# Patient Record
Sex: Male | Born: 1955 | Race: White | Hispanic: No | Marital: Married | Smoking: Never smoker
Health system: Southern US, Community
[De-identification: ages and names within clinical notes are randomized; demographics above are authoritative.]

## PROBLEM LIST (undated history)

## (undated) DIAGNOSIS — E119 Type 2 diabetes mellitus without complications: Secondary | ICD-10-CM

## (undated) DIAGNOSIS — G4730 Sleep apnea, unspecified: Secondary | ICD-10-CM

## (undated) DIAGNOSIS — I10 Essential (primary) hypertension: Secondary | ICD-10-CM

## (undated) HISTORY — PX: JOINT REPLACEMENT: SHX530

## (undated) HISTORY — PX: BACK SURGERY: SHX140

---

## 2021-01-09 ENCOUNTER — Other Ambulatory Visit: Payer: Self-pay | Admitting: Student

## 2021-01-09 DIAGNOSIS — G8929 Other chronic pain: Secondary | ICD-10-CM

## 2021-01-09 DIAGNOSIS — M7582 Other shoulder lesions, left shoulder: Secondary | ICD-10-CM

## 2021-01-09 DIAGNOSIS — Z9889 Other specified postprocedural states: Secondary | ICD-10-CM

## 2021-01-09 DIAGNOSIS — M25512 Pain in left shoulder: Secondary | ICD-10-CM

## 2021-01-22 ENCOUNTER — Other Ambulatory Visit: Payer: Self-pay

## 2021-01-22 ENCOUNTER — Ambulatory Visit
Admission: RE | Admit: 2021-01-22 | Discharge: 2021-01-22 | Disposition: A | Discharge status: Home / Self Care | Payer: Federal, State, Local not specified - PPO | Source: Ambulatory Visit | Attending: Student | Admitting: Student

## 2021-01-22 DIAGNOSIS — M25512 Pain in left shoulder: Secondary | ICD-10-CM | POA: Diagnosis present

## 2021-01-22 DIAGNOSIS — G8929 Other chronic pain: Secondary | ICD-10-CM | POA: Diagnosis present

## 2021-01-22 DIAGNOSIS — Z9889 Other specified postprocedural states: Secondary | ICD-10-CM | POA: Diagnosis not present

## 2021-01-22 DIAGNOSIS — M7582 Other shoulder lesions, left shoulder: Secondary | ICD-10-CM

## 2021-02-04 ENCOUNTER — Encounter: Payer: Self-pay | Admitting: Emergency Medicine

## 2021-02-04 ENCOUNTER — Other Ambulatory Visit: Payer: Self-pay

## 2021-02-04 ENCOUNTER — Emergency Department
Admission: EM | Admit: 2021-02-04 | Discharge: 2021-02-04 | Disposition: A | Discharge status: Home / Self Care | Payer: Federal, State, Local not specified - PPO | Attending: Emergency Medicine | Admitting: Emergency Medicine

## 2021-02-04 DIAGNOSIS — I10 Essential (primary) hypertension: Secondary | ICD-10-CM | POA: Insufficient documentation

## 2021-02-04 DIAGNOSIS — R000 Tachycardia, unspecified: Secondary | ICD-10-CM | POA: Diagnosis not present

## 2021-02-04 DIAGNOSIS — E119 Type 2 diabetes mellitus without complications: Secondary | ICD-10-CM | POA: Insufficient documentation

## 2021-02-04 DIAGNOSIS — Z20822 Contact with and (suspected) exposure to covid-19: Secondary | ICD-10-CM | POA: Insufficient documentation

## 2021-02-04 DIAGNOSIS — K529 Noninfective gastroenteritis and colitis, unspecified: Secondary | ICD-10-CM | POA: Diagnosis not present

## 2021-02-04 DIAGNOSIS — R509 Fever, unspecified: Secondary | ICD-10-CM | POA: Diagnosis present

## 2021-02-04 HISTORY — DX: Type 2 diabetes mellitus without complications: E11.9

## 2021-02-04 HISTORY — DX: Essential (primary) hypertension: I10

## 2021-02-04 LAB — CBC WITH DIFFERENTIAL/PLATELET
Abs Immature Granulocytes: 0.04 10*3/uL (ref 0.00–0.07)
Basophils Absolute: 0 10*3/uL (ref 0.0–0.1)
Basophils Relative: 0 %
Eosinophils Absolute: 0.1 10*3/uL (ref 0.0–0.5)
Eosinophils Relative: 1 %
HCT: 31.4 % — ABNORMAL LOW (ref 39.0–52.0)
Hemoglobin: 11.2 g/dL — ABNORMAL LOW (ref 13.0–17.0)
Immature Granulocytes: 1 %
Lymphocytes Relative: 7 %
Lymphs Abs: 0.6 10*3/uL — ABNORMAL LOW (ref 0.7–4.0)
MCH: 32.4 pg (ref 26.0–34.0)
MCHC: 35.7 g/dL (ref 30.0–36.0)
MCV: 90.8 fL (ref 80.0–100.0)
Monocytes Absolute: 0.6 10*3/uL (ref 0.1–1.0)
Monocytes Relative: 7 %
Neutro Abs: 7.3 10*3/uL (ref 1.7–7.7)
Neutrophils Relative %: 84 %
Platelets: 249 10*3/uL (ref 150–400)
RBC: 3.46 MIL/uL — ABNORMAL LOW (ref 4.22–5.81)
RDW: 13.6 % (ref 11.5–15.5)
WBC: 8.6 10*3/uL (ref 4.0–10.5)
nRBC: 0 % (ref 0.0–0.2)

## 2021-02-04 LAB — COMPREHENSIVE METABOLIC PANEL
ALT: 158 U/L — ABNORMAL HIGH (ref 0–44)
AST: 56 U/L — ABNORMAL HIGH (ref 15–41)
Albumin: 3.4 g/dL — ABNORMAL LOW (ref 3.5–5.0)
Alkaline Phosphatase: 69 U/L (ref 38–126)
Anion gap: 11 (ref 5–15)
BUN: 24 mg/dL — ABNORMAL HIGH (ref 8–23)
CO2: 24 mmol/L (ref 22–32)
Calcium: 9 mg/dL (ref 8.9–10.3)
Chloride: 98 mmol/L (ref 98–111)
Creatinine, Ser: 1.01 mg/dL (ref 0.61–1.24)
GFR, Estimated: >60 mL/min (ref >60)
Glucose, Bld: 136 mg/dL — ABNORMAL HIGH (ref 70–99)
Potassium: 3.7 mmol/L (ref 3.5–5.1)
Sodium: 133 mmol/L — ABNORMAL LOW (ref 135–145)
Total Bilirubin: 2.4 mg/dL — ABNORMAL HIGH (ref 0.3–1.2)
Total Protein: 6.8 g/dL (ref 6.5–8.1)

## 2021-02-04 LAB — GROUP A STREP BY PCR: Group A Strep by PCR: NOT DETECTED

## 2021-02-04 LAB — LIPASE, BLOOD: Lipase: 24 U/L (ref 11–51)

## 2021-02-04 MED ORDER — SODIUM CHLORIDE 0.9 % IV BOLUS
1000.0000 mL | Freq: Once | INTRAVENOUS | Status: AC
  Administered 2021-02-04: 1000 mL via INTRAVENOUS

## 2021-02-04 MED ORDER — KETOROLAC TROMETHAMINE 30 MG/ML IJ SOLN
30.0000 mg | Freq: Once | INTRAMUSCULAR | Status: AC
  Administered 2021-02-04: 30 mg via INTRAVENOUS
  Filled 2021-02-04: qty 1

## 2021-02-04 MED ORDER — ONDANSETRON HCL 8 MG PO TABS: 8.0000 mg | ORAL_TABLET | Freq: Three times a day (TID) | ORAL | 0 refills | Status: DC | PRN

## 2021-02-04 MED ORDER — ONDANSETRON HCL 4 MG/2ML IJ SOLN
4.0000 mg | Freq: Once | INTRAMUSCULAR | Status: AC
  Administered 2021-02-04: 4 mg via INTRAVENOUS
  Filled 2021-02-04: qty 2

## 2021-02-04 NOTE — ED Provider Notes (Signed)
[facility] Emergency Department Provider Note   ____________________________________________   Event Date/Time   First MD Initiated Contact with Patient 02/04/21 1311     (approximate)  I have reviewed the triage vital signs and the nursing notes.   HISTORY  Chief Complaint Fever and Emesis    HPI Ricky Figueroa is a 65 y.o. male patient presents with tenderness and fever for 3 days.  Patient states symptoms began 5 days ago for sore throat.  Patient said he had diarrhea until Friday.  Patient state nausea and 3 episodes of vomiting today.  Cannot tolerate fluids.  Denies bloody stools.  Patient denies recent travel or known contact with COVID-19.  Patient has taken the COVID-19 vaccine to include boosters.  Has not taken flu shot this season.         Past Medical History:  Diagnosis Date  . Diabetes mellitus without complication (HCC)   . Hypertension     There are no problems to display for this patient.   History reviewed. No pertinent surgical history.  Prior to Admission medications   Medication Sig Start Date End Date Taking? Authorizing Provider  ondansetron (ZOFRAN) 8 MG tablet Take 1 tablet (8 mg total) by mouth every 8 (eight) hours as needed for nausea or vomiting. 02/04/21  Yes Joni Reining, PA-C    Allergies Patient has no allergy information on record.  No family history on file.  Social History Social History   Tobacco Use  . Smoking status: Never Smoker  . Smokeless tobacco: Never Used  Substance Use Topics  . Alcohol use: Yes    Comment: occasional  . Drug use: Never    Review of Systems Constitutional: No fever/chills Eyes: No visual changes. ENT: No sore throat. Cardiovascular: Denies chest pain. Respiratory: Denies shortness of breath. Gastrointestinal: No abdominal pain.  Vomiting and diarrhea.   Genitourinary: Negative for dysuria. Musculoskeletal: Negative for back pain. Skin: Negative for  rash. Neurological: Negative for headaches, focal weakness or numbness. Endocrine:  Diabetes and hypertension.  ____________________________________________   PHYSICAL EXAM:  VITAL SIGNS: ED Triage Vitals  Enc Vitals Group     BP 02/04/21 1301 110/66     Pulse Rate 02/04/21 1301 (!) 115     Resp 02/04/21 1301 18     Temp 02/04/21 1301 98.3 F (36.8 C)     Temp Source 02/04/21 1301 Oral     SpO2 02/04/21 1301 96 %     Weight 02/04/21 1302 217 lb (98.4 kg)     Height --      Head Circumference --      Peak Flow --      Pain Score --      Pain Loc --      Pain Edu? --      Excl. in GC? --     Constitutional: Alert and oriented. Well appearing and in no acute distress. Eyes: Conjunctivae are normal. PERRL. EOMI. Head: Atraumatic. Nose: No congestion/rhinnorhea. Mouth/Throat: Mucous membranes are dry.  Oropharynx non-erythematous. Neck: No stridor.  Hematological/Lymphatic/Immunilogical: No cervical lymphadenopathy. Cardiovascular: Tachycardic, regular rhythm. Grossly normal heart sounds.  Good peripheral circulation. Respiratory: Normal respiratory effort.  No retractions. Lungs CTAB. Gastrointestinal: Soft and nontender. No distention. No abdominal bruits. No CVA tenderness. Genitourinary: Deferred Musculoskeletal: No lower extremity tenderness nor edema.  No joint effusions. Neurologic:  Normal speech and language. No gross focal neurologic deficits are appreciated. No gait instability. Skin:  Skin is warm, dry and intact.  No rash noted. Psychiatric: Mood and affect are normal. Speech and behavior are normal.  ____________________________________________   LABS (all labs ordered are listed, but only abnormal results are displayed)  Labs Reviewed  COMPREHENSIVE METABOLIC PANEL - Abnormal; Notable for the following components:      Result Value   Sodium 133 (*)    Glucose, Bld 136 (*)    BUN 24 (*)    Albumin 3.4 (*)    AST 56 (*)    ALT 158 (*)    Total  Bilirubin 2.4 (*)    All other components within normal limits  CBC WITH DIFFERENTIAL/PLATELET - Abnormal; Notable for the following components:   RBC 3.46 (*)    Hemoglobin 11.2 (*)    HCT 31.4 (*)    Lymphs Abs 0.6 (*)    All other components within normal limits  GROUP A STREP BY PCR  SARS CORONAVIRUS 2 (TAT 6-24 HRS)  LIPASE, BLOOD   ____________________________________________  EKG   ____________________________________________  RADIOLOGY I, Joni Reining, personally viewed and evaluated these images (plain radiographs) as part of my medical decision making, as well as reviewing the written report by the radiologist.  ED MD interpretation:   Official radiology report(s): No results found.  ____________________________________________   PROCEDURES  Procedure(s) performed (including Critical Care):  Procedures   ____________________________________________   INITIAL IMPRESSION / ASSESSMENT AND PLAN / ED COURSE  As part of my medical decision making, I reviewed the following data within the electronic MEDICAL RECORD NUMBER         Patient presents with nausea and vomiting for few days.  Patient complaint was preceded by a sore throat.  Patient had 3 episodes of vomiting today.  Discussed negative Covid 19 other lab results with patient.  Patient responded well to IV rehydration and Zofran.  Patient given discharge care instruction advised take medication as directed.      ____________________________________________   FINAL CLINICAL IMPRESSION(S) / ED DIAGNOSES  Final diagnoses:  Gastroenteritis     ED Discharge Orders         Ordered    ondansetron (ZOFRAN) 8 MG tablet  Every 8 hours PRN        02/04/21 1522          *Please note:  Dennie Moltz was evaluated in Emergency Department on 02/05/2021 for the symptoms described in the history of present illness. He was evaluated in the context of the global COVID-19 pandemic, which necessitated  consideration that the patient might be at risk for infection with the SARS-CoV-2 virus that causes COVID-19. Institutional protocols and algorithms that pertain to the evaluation of patients at risk for COVID-19 are in a state of rapid change based on information released by regulatory bodies including the CDC and federal and state organizations. These policies and algorithms were followed during the patient's care in the ED.  Some ED evaluations and interventions may be delayed as a result of limited staffing during and the pandemic.*   Note:  This document was prepared using Dragon voice recognition software and may include unintentional dictation errors.    Joni Reining, PA-C 02/05/21 1544    [place]ton [place], MD 02/07/21 [phone]

## 2021-02-04 NOTE — Discharge Instructions (Addendum)
Follow discharge care instruction take medication as directed.  Follow-up with your treating doctor if no improvement in 3 days.  Return to ED if condition worsens.  Advised self quarantine pending results of COVID-19 test.  Test results can be found in the MyChart app later today or in the morning.  If test is positive must quarantine per CDC recommendations.

## 2021-02-04 NOTE — ED Triage Notes (Signed)
Pt in w/emesis and fever x few days. States symptoms first began last Wednesday w/sore throat, and he had diarrhea Friday. Emesis x few days, 3 episodes today. Temp 98.3 in triage. Denies any sob or cp

## 2021-02-04 NOTE — ED Triage Notes (Signed)
Denies any sick contacts

## 2021-02-05 LAB — SARS CORONAVIRUS 2 (TAT 6-24 HRS): SARS Coronavirus 2: NEGATIVE

## 2021-02-08 ENCOUNTER — Other Ambulatory Visit: Payer: Self-pay | Admitting: Family Medicine

## 2021-02-08 ENCOUNTER — Other Ambulatory Visit: Payer: Self-pay

## 2021-02-08 ENCOUNTER — Ambulatory Visit
Admission: RE | Admit: 2021-02-08 | Discharge: 2021-02-08 | Disposition: A | Discharge status: Home / Self Care | Payer: Federal, State, Local not specified - PPO | Source: Ambulatory Visit | Attending: Family Medicine | Admitting: Family Medicine

## 2021-02-08 DIAGNOSIS — R112 Nausea with vomiting, unspecified: Secondary | ICD-10-CM

## 2021-02-08 DIAGNOSIS — R17 Unspecified jaundice: Secondary | ICD-10-CM | POA: Diagnosis present

## 2021-02-08 DIAGNOSIS — R197 Diarrhea, unspecified: Secondary | ICD-10-CM | POA: Insufficient documentation

## 2021-02-11 ENCOUNTER — Other Ambulatory Visit: Payer: Self-pay | Admitting: Internal Medicine

## 2021-02-11 DIAGNOSIS — R509 Fever, unspecified: Secondary | ICD-10-CM

## 2021-02-11 DIAGNOSIS — R7989 Other specified abnormal findings of blood chemistry: Secondary | ICD-10-CM

## 2021-02-11 DIAGNOSIS — R1010 Upper abdominal pain, unspecified: Secondary | ICD-10-CM

## 2021-02-12 ENCOUNTER — Other Ambulatory Visit: Payer: Self-pay

## 2021-02-12 ENCOUNTER — Ambulatory Visit
Admission: RE | Admit: 2021-02-12 | Discharge: 2021-02-12 | Disposition: A | Discharge status: Home / Self Care | Payer: Federal, State, Local not specified - PPO | Source: Ambulatory Visit | Attending: Internal Medicine | Admitting: Internal Medicine

## 2021-02-12 DIAGNOSIS — R509 Fever, unspecified: Secondary | ICD-10-CM | POA: Diagnosis present

## 2021-02-12 DIAGNOSIS — R7989 Other specified abnormal findings of blood chemistry: Secondary | ICD-10-CM | POA: Diagnosis present

## 2021-02-12 DIAGNOSIS — R1010 Upper abdominal pain, unspecified: Secondary | ICD-10-CM | POA: Diagnosis present

## 2021-04-08 ENCOUNTER — Other Ambulatory Visit: Payer: Self-pay | Admitting: Internal Medicine

## 2021-04-08 DIAGNOSIS — I7090 Unspecified atherosclerosis: Secondary | ICD-10-CM

## 2021-04-08 DIAGNOSIS — R42 Dizziness and giddiness: Secondary | ICD-10-CM

## 2021-04-08 DIAGNOSIS — I708 Atherosclerosis of other arteries: Secondary | ICD-10-CM

## 2021-04-11 LAB — COLOGUARD: COLOGUARD: NEGATIVE

## 2021-04-11 LAB — EXTERNAL GENERIC LAB PROCEDURE: COLOGUARD: NEGATIVE

## 2021-04-24 ENCOUNTER — Other Ambulatory Visit: Payer: Self-pay

## 2021-04-24 ENCOUNTER — Ambulatory Visit
Admission: RE | Admit: 2021-04-24 | Discharge: 2021-04-24 | Disposition: A | Discharge status: Home / Self Care | Payer: Federal, State, Local not specified - PPO | Source: Ambulatory Visit | Attending: Internal Medicine | Admitting: Internal Medicine

## 2021-04-24 ENCOUNTER — Ambulatory Visit: Payer: Federal, State, Local not specified - PPO

## 2021-04-24 DIAGNOSIS — R42 Dizziness and giddiness: Secondary | ICD-10-CM | POA: Diagnosis not present

## 2021-04-24 DIAGNOSIS — I7090 Unspecified atherosclerosis: Secondary | ICD-10-CM

## 2021-04-24 DIAGNOSIS — I708 Atherosclerosis of other arteries: Secondary | ICD-10-CM | POA: Diagnosis present

## 2021-05-13 ENCOUNTER — Other Ambulatory Visit: Payer: Self-pay

## 2021-05-13 ENCOUNTER — Encounter: Payer: Self-pay | Admitting: Cardiology

## 2021-05-13 ENCOUNTER — Ambulatory Visit: Payer: Federal, State, Local not specified - PPO | Admitting: Cardiology

## 2021-05-13 VITALS — BP 90/58 | HR 54 | Ht 69.0 in | Wt 213.0 lb

## 2021-05-13 DIAGNOSIS — R42 Dizziness and giddiness: Secondary | ICD-10-CM

## 2021-05-13 DIAGNOSIS — E7800 Pure hypercholesterolemia, unspecified: Secondary | ICD-10-CM

## 2021-05-13 DIAGNOSIS — R072 Precordial pain: Secondary | ICD-10-CM

## 2021-05-13 DIAGNOSIS — I10 Essential (primary) hypertension: Secondary | ICD-10-CM | POA: Diagnosis not present

## 2021-05-13 MED ORDER — IVABRADINE HCL 5 MG PO TABS: ORAL_TABLET | ORAL | 0 refills | Status: DC

## 2021-05-13 NOTE — Progress Notes (Addendum)
Cardiology Office Note:    Date:  05/13/2021   ID:  Ricky Figueroa, DOB 21-Jan-1956, MRN [id number]  PCP:  Enid Baas, MD   [facility] Providers Cardiologist:  None     Referring MD: Enid Baas, MD   Chief Complaint  Patient presents with  . New Patient (Initial Visit)    Referred by PCP for Angina. Patient c.o when exerting himself - Dizziness. Meds reviewed verbally with patient.    Ricky Figueroa is a 65 y.o. male who is being seen today for the evaluation of chest pain at the request of Enid Baas, MD.   History of Present Illness:    Ricky Figueroa is a 65 y.o. male with a hx of hypertension, diabetes, hyperlipidemia who presents due to chest pain and dizziness.  Patient has symptoms of chest discomfort which he describes as soreness, rates it as 4 out of 10, located in the mid chest, not associated with exertion.  Symptoms have been ongoing for several months now.  Also endorses fatigue, dizziness ongoing for 3 to 4 months.  Symptoms began after he felt sick 3 months ago.  Had symptoms of fever, nausea.  Denies COVID infection.  His father has a history of MI in his 74s.  He denies smoking.  Past Medical History:  Diagnosis Date  . Diabetes mellitus without complication (HCC)   . Hypertension     History reviewed. No pertinent surgical history.  Current Medications: Current Meds  Medication Sig  . allopurinol (ZYLOPRIM) 300 MG tablet Take 450 mg by mouth daily.  Marland Kitchen aspirin 81 MG chewable tablet Chew 81 mg by mouth daily.  . DULoxetine (CYMBALTA) 60 MG capsule Take 60 mg by mouth 2 (two) times daily.  . folic acid (FOLVITE) 1 MG tablet Take 1 mg by mouth daily.  . isosorbide mononitrate (IMDUR) 30 MG 24 hr tablet Take 30 mg by mouth daily.  Marland Kitchen losartan-hydrochlorothiazide (HYZAAR) 100-25 MG tablet Take 0.5 tablet (50-12.5 mg) once daily  . metFORMIN (GLUCOPHAGE) 500 MG tablet Take 1 tablet by mouth 2 (two) times daily.  .  RABEprazole (ACIPHEX) 20 MG tablet Take 20 mg by mouth daily.  . simvastatin (ZOCOR) 40 MG tablet Take 40 mg by mouth daily.  . [DISCONTINUED] amLODipine (NORVASC) 5 MG tablet Take 1 tablet by mouth 2 (two) times daily.  . [DISCONTINUED] ivabradine (CORLANOR) 5 MG TABS tablet Take 2 tablets (10 mg) by mouth 1-2 hours prior to your Cardiac CT     Allergies:   Iodine and Oxycodone-acetaminophen   Social History   Socioeconomic History  . Marital status: Married    Spouse name: Not on file  . Number of children: Not on file  . Years of education: Not on file  . Highest education level: Not on file  Occupational History  . Not on file  Tobacco Use  . Smoking status: Never Smoker  . Smokeless tobacco: Never Used  Substance and Sexual Activity  . Alcohol use: Yes    Comment: occasional  . Drug use: Never  . Sexual activity: Not on file  Other Topics Concern  . Not on file  Social History Narrative  . Not on file   Social Determinants of Health   Financial Resource Strain: Not on file  Food Insecurity: Not on file  Transportation Needs: Not on file  Physical Activity: Not on file  Stress: Not on file  Social Connections: Not on file     Family History: The patient's  family history is not on file.  ROS:   Please see the history of present illness.     All other systems reviewed and are negative.  EKGs/Labs/Other Studies Reviewed:    The following studies were reviewed today:   EKG:  EKG is  ordered today.  The ekg ordered today demonstrates sinus bradycardia, heart rate 54  Recent Labs: 02/04/2021: ALT 158; BUN 24; Creatinine, Ser 1.01; Hemoglobin 11.2; Platelets 249; Potassium 3.7; Sodium 133  Recent Lipid Panel No results found for: CHOL, TRIG, HDL, CHOLHDL, VLDL, LDLCALC, LDLDIRECT   Risk Assessment/Calculations:      Physical Exam:    VS:  BP (!) 90/58 (BP Location: Left Arm, Patient Position: Sitting, Cuff Size: Normal)   Pulse (!) 54   Ht  (1.753  m)   Wt 213 lb (96.6 kg)   SpO2 98%   BMI 31.45 kg/m     Wt Readings from Last 3 Encounters:  05/13/21 213 lb (96.6 kg)  02/04/21 217 lb (98.4 kg)     GEN:  Well nourished, well developed in no acute distress HEENT: Normal NECK: No JVD; No carotid bruits LYMPHATICS: No lymphadenopathy CARDIAC: RRR, no murmurs, rubs, gallops RESPIRATORY:  Clear to auscultation without rales, wheezing or rhonchi  ABDOMEN: Soft, non-tender, non-distended MUSCULOSKELETAL:  No edema; No deformity  SKIN: Warm and dry NEUROLOGIC:  Alert and oriented x 3 PSYCHIATRIC:  Normal affect   ASSESSMENT:    1. Precordial pain   2. Dizziness   3. Primary hypertension   4. Pure hypercholesterolemia    PLAN:    In order of problems listed above:  1. Chest pain, risk factors hypertension, hyperlipidemia, family history of CAD, diabetes. obtain [employer] (patient has contrast/cardiac diet allergies-anaphylaxis listed), obtain echo echocardiogram.  Continue Imdur. 2. Dizziness, vitals in the office were orthostatic, systolic blood pressures 90/58.  Stop amlodipine, reduce Hyzaar to 50-12.5 mg daily.  Advised to check BP daily.  Adjust medications as needed. 3. Hypertension, BP low today.  Adjust BP meds as above. 4. Hyperlipidemia, continue statin.  Follow-up after echo and stress testing  Shared Decision Making/Informed Consent The risks [chest pain, shortness of breath, cardiac arrhythmias, dizziness, blood pressure fluctuations, myocardial infarction, stroke/transient ischemic attack, nausea, vomiting, allergic reaction, radiation exposure, metallic taste sensation and life-threatening complications (estimated to be 1 in 10,000)], benefits (risk stratification, diagnosing coronary artery disease, treatment guidance) and alternatives of a nuclear stress test were discussed in detail with [name] and he agrees to proceed.    Medication Adjustments/Labs and Tests Ordered: Current medicines are  reviewed at length with the patient today.  Concerns regarding medicines are outlined above.  Orders Placed This Encounter  Procedures  . NM Myocar Multi W/Spect W/Wall Motion / EF  . Cardiac Stress Test: Informed Consent Details: Physician/Practitioner Attestation; Transcribe to consent form and obtain patient signature  . EKG 12-Lead  . ECHOCARDIOGRAM COMPLETE   Meds ordered this encounter  Medications  . DISCONTD: ivabradine (CORLANOR) 5 MG TABS tablet    Sig: Take 2 tablets (10 mg) by mouth 1-2 hours prior to your Cardiac CT    Dispense:  2 tablet    Refill:  0    Patient Instructions   Medication Instructions:  - Your physician has recommended you make the following change in your medication:   1) STOP norvasc (amlodipine)  2) DECREASE hyzaar 100-25 mg- take 0.5 tablet (50-12.5 mg) by mouth once daily    *If you need a refill on  your cardiac medications before your next appointment, please call your pharmacy*   Lab Work: - none ordered  If you have labs (blood work) drawn today and your tests are completely normal, you will receive your results only by: Marland Kitchen MyChart Message (if you have MyChart) OR . A paper copy in the mail If you have any lab test that is abnormal or we need to change your treatment, we will call you to review the results.   Testing/Procedures:  1) Echocardiogram:  - Your physician has requested that you have an echocardiogram. Echocardiography is a painless test that uses sound waves to create images of your heart. It provides your doctor with information about the size and shape of your heart and how well your heart's chambers and valves are working. This procedure takes approximately one hour. There are no restrictions for this procedure.There is a possibility that an IV may need to be started during your test to inject an image enhancing agent. This is done to obtain more optimal pictures of your heart. Therefore we ask that you do at least drink  some water prior to coming in to hydrate your veins.    2) Lexiscan Myoview: - Your physician has requested that you have a lexiscan myoview.   [facility] MYOVIEW  Your caregiver has ordered a Stress Test with nuclear imaging. The purpose of this test is to evaluate the blood supply to your heart muscle. This procedure is referred to as a "Non-Invasive Stress Test." This is because other than having an IV started in your vein, nothing is inserted or "invades" your body. Cardiac stress tests are done to find areas of poor blood flow to the heart by determining the extent of coronary artery disease (CAD). Some patients exercise on a treadmill, which naturally increases the blood flow to your heart, while others who are  unable to walk on a treadmill due to physical limitations have a pharmacologic/chemical stress agent called Lexiscan . This medicine will mimic walking on a treadmill by temporarily increasing your coronary blood flow.   Please note: these test may take anywhere between 2-4 hours to complete  PLEASE REPORT TO [facility] MEDICAL MALL ENTRANCE  THE VOLUNTEERS AT THE FIRST DESK WILL DIRECT YOU WHERE TO GO  Date of Procedure:_____________________________________  Arrival Time for Procedure:______________________________  Instructions regarding medication:   _x___ : Hold ALL diabetes medication morning of procedure  _x__:  You may take all of your other regular medications the morning of your test with enough water to get them down safely  PLEASE NOTIFY THE OFFICE AT LEAST 24 HOURS IN ADVANCE IF YOU ARE UNABLE TO KEEP YOUR APPOINTMENT.  [phone] AND  PLEASE NOTIFY NUCLEAR MEDICINE AT [facility] AT LEAST 24 HOURS IN ADVANCE IF YOU ARE UNABLE TO KEEP YOUR APPOINTMENT. [phone]  How to prepare for your Myoview test:  5. Do not eat or drink after midnight 6. No caffeine for 24 hours prior to test 7. No smoking 24 hours prior to test. 8. Your medication may be taken with water.  If your  doctor stopped a medication because of this test, do not take that medication. 9. Ladies, please do not wear dresses.  Skirts or pants are appropriate. Please wear a short sleeve shirt. 10. No perfume, cologne or lotion. 11. Wear comfortable walking shoes. No heels!    Follow-Up: At [facility], you and your health needs are our priority.  As part of our continuing mission to provide you with exceptional heart care,  we have created designated Provider Care Teams.  These Care Teams include your primary Cardiologist (physician) and Advanced Practice Providers (APPs -  Physician Assistants and Nurse Practitioners) who all work together to provide you with the care you need, when you need it.  We recommend signing up for the patient portal called "MyChart".  Sign up information is provided on this After Visit Summary.  MyChart is used to connect with patients for Virtual Visits (Telemedicine).  Patients are able to view lab/test results, encounter notes, upcoming appointments, etc.  Non-urgent messages can be sent to your provider as well.   To learn more about what you can do with MyChart, go to [email or web address].    Your next appointment:   6 week(s)/ after all testing is completed  The format for your next appointment:   In Person  Provider:   You may see Debbe Odea, MD or one of the following Advanced Practice Providers on your designated Care Team:    Nicolasa Ducking, NP  Eula Listen, PA-C  Marisue Ivan, PA-C  Cadence Miranda, [place]  Gillian Shields, NP    Other Instructions   Echocardiogram An echocardiogram is a test that uses sound waves (ultrasound) to produce images of the heart. Images from an echocardiogram can provide important information about:  Heart size and shape.  The size and thickness and movement of your heart's walls.  Heart muscle function and strength.  Heart valve function or if you have stenosis. Stenosis is when the heart valves  are too narrow.  If blood is flowing backward through the heart valves (regurgitation).  A tumor or infectious growth around the heart valves.  Areas of heart muscle that are not working well because of poor blood flow or injury from a heart attack.  Aneurysm detection. An aneurysm is a weak or damaged part of an artery wall. The wall bulges out from the normal force of blood pumping through the body. Tell a health care provider about:  Any allergies you have.  All medicines you are taking, including vitamins, herbs, eye drops, creams, and over-the-counter medicines.  Any blood disorders you have.  Any surgeries you have had.  Any medical conditions you have.  Whether you are pregnant or may be pregnant. What are the risks? Generally, this is a safe test. However, problems may occur, including an allergic reaction to dye (contrast) that may be used during the test. What happens before the test? No specific preparation is needed. You may eat and drink normally. What happens during the test?  You will take off your clothes from the waist up and put on a hospital gown.  Electrodes or electrocardiogram (ECG)patches may be placed on your chest. The electrodes or patches are then connected to a device that monitors your heart rate and rhythm.  You will lie down on a table for an ultrasound exam. A gel will be applied to your chest to help sound waves pass through your skin.  A handheld device, called a transducer, will be pressed against your chest and moved over your heart. The transducer produces sound waves that travel to your heart and bounce back (or "echo" back) to the transducer. These sound waves will be captured in real-time and changed into images of your heart that can be viewed on a video monitor. The images will be recorded on a computer and reviewed by your health care provider.  You may be asked to change positions or hold your breath for a short time. This  makes it  easier to get different views or better views of your heart.  In some cases, you may receive contrast through an IV in one of your veins. This can improve the quality of the pictures from your heart. The procedure may vary among health care providers and hospitals.   What can I expect after the test? You may return to your normal, everyday life, including diet, activities, and medicines, unless your health care provider tells you not to do that. Follow these instructions at home:  It is up to you to get the results of your test. Ask your health care provider, or the department that is doing the test, when your results will be ready.  Keep all follow-up visits. This is important. Summary  An echocardiogram is a test that uses sound waves (ultrasound) to produce images of the heart.  Images from an echocardiogram can provide important information about the size and shape of your heart, heart muscle function, heart valve function, and other possible heart problems.  You do not need to do anything to prepare before this test. You may eat and drink normally.  After the echocardiogram is completed, you may return to your normal, everyday life, unless your health care provider tells you not to do that. This information is not intended to replace advice given to you by your health care provider. Make sure you discuss any questions you have with your health care provider. Document Revised: 07/17/2020 Document Reviewed: 07/17/2020 Elsevier Patient Education  2021 Elsevier Inc.    Cardiac Nuclear Scan A cardiac nuclear scan is a test that is done to check the flow of blood to your heart. It is done when you are resting and when you are exercising. The test looks for problems such as:  Not enough blood reaching a portion of the heart.  The heart muscle not working as it should. You may need this test if:  You have heart disease.  You have had lab results that are not normal.  You have had  heart surgery or a balloon procedure to open up blocked arteries (angioplasty).  You have chest pain.  You have shortness of breath. In this test, a special dye (tracer) is put into your bloodstream. The tracer will travel to your heart. A camera will then take pictures of your heart to see how the tracer moves through your heart. This test is usually done at a hospital and takes 2-4 hours. Tell a doctor about:  Any allergies you have.  All medicines you are taking, including vitamins, herbs, eye drops, creams, and over-the-counter medicines.  Any problems you or family members have had with anesthetic medicines.  Any blood disorders you have.  Any surgeries you have had.  Any medical conditions you have.  Whether you are pregnant or may be pregnant. What are the risks? Generally, this is a safe test. However, problems may occur, such as:  Serious chest pain and heart attack. This is only a risk if the stress portion of the test is done.  Rapid heartbeat.  A feeling of warmth in your chest. This feeling usually does not last long.  Allergic reaction to the tracer. What happens before the test?  Ask your doctor about changing or stopping your normal medicines. This is important.  Follow instructions from your doctor about what you cannot eat or drink.  Remove your jewelry on the day of the test. What happens during the test?  An IV tube will be inserted  into one of your veins.  Your doctor will give you a small amount of tracer through the IV tube.  You will wait for 20-40 minutes while the tracer moves through your bloodstream.  Your heart will be monitored with an electrocardiogram (ECG).  You will lie down on an exam table.  Pictures of your heart will be taken for about 15-20 minutes.  You may also have a stress test. For this test, one of these things may be done: ? You will be asked to exercise on a treadmill or a stationary bike. ? You will be given  medicines that will make your heart work harder. This is done if you are unable to exercise.  When blood flow to your heart has peaked, a tracer will again be given through the IV tube.  After 20-40 minutes, you will get back on the exam table. More pictures will be taken of your heart.  Depending on the tracer that is used, more pictures may need to be taken 3-4 hours later.  Your IV tube will be removed when the test is over. The test may vary among doctors and hospitals. What happens after the test?  Ask your doctor: ? Whether you can return to your normal schedule, including diet, activities, and medicines. ? Whether you should drink more fluids. This will help to remove the tracer from your body. Drink enough fluid to keep your pee (urine) pale yellow.  Ask your doctor, or the department that is doing the test: ? When will my results be ready? ? How will I get my results? Summary  A cardiac nuclear scan is a test that is done to check the flow of blood to your heart.  Tell your doctor whether you are pregnant or may be pregnant.  Before the test, ask your doctor about changing or stopping your normal medicines. This is important.  Ask your doctor whether you can return to your normal activities. You may be asked to drink more fluids. This information is not intended to replace advice given to you by your health care provider. Make sure you discuss any questions you have with your health care provider. Document Revised: 03/16/2019 Document Reviewed: 05/10/2018 Elsevier Patient Education  2021 Elsevier Inc.      Signed, Debbe Odea, MD  05/13/2021 3:19 PM    [facility]

## 2021-05-13 NOTE — Patient Instructions (Addendum)
Medication Instructions:  - Your physician has recommended you make the following change in your medication:   1) STOP norvasc (amlodipine)  2) DECREASE hyzaar 100-25 mg- take 0.5 tablet (50-12.5 mg) by mouth once daily    *If you need a refill on your cardiac medications before your next appointment, please call your pharmacy*   Lab Work: - none ordered  If you have labs (blood work) drawn today and your tests are completely normal, you will receive your results only by: Marland Kitchen MyChart Message (if you have MyChart) OR . A paper copy in the mail If you have any lab test that is abnormal or we need to change your treatment, we will call you to review the results.   Testing/Procedures:  1) Echocardiogram:  - Your physician has requested that you have an echocardiogram. Echocardiography is a painless test that uses sound waves to create images of your heart. It provides your doctor with information about the size and shape of your heart and how well your heart's chambers and valves are working. This procedure takes approximately one hour. There are no restrictions for this procedure.There is a possibility that an IV may need to be started during your test to inject an image enhancing agent. This is done to obtain more optimal pictures of your heart. Therefore we ask that you do at least drink some water prior to coming in to hydrate your veins.    2) Lexiscan Myoview: - Your physician has requested that you have a lexiscan myoview.   [facility] MYOVIEW  Your caregiver has ordered a Stress Test with nuclear imaging. The purpose of this test is to evaluate the blood supply to your heart muscle. This procedure is referred to as a "Non-Invasive Stress Test." This is because other than having an IV started in your vein, nothing is inserted or "invades" your body. Cardiac stress tests are done to find areas of poor blood flow to the heart by determining the extent of coronary artery disease (CAD). Some  patients exercise on a treadmill, which naturally increases the blood flow to your heart, while others who are  unable to walk on a treadmill due to physical limitations have a pharmacologic/chemical stress agent called Lexiscan . This medicine will mimic walking on a treadmill by temporarily increasing your coronary blood flow.   Please note: these test may take anywhere between 2-4 hours to complete  PLEASE REPORT TO [facility]  THE VOLUNTEERS AT THE FIRST DESK WILL DIRECT YOU WHERE TO GO  Date of Procedure:_____________________________________  Arrival Time for Procedure:______________________________  Instructions regarding medication:   _x___ : Hold ALL diabetes medication morning of procedure  _x__:  You may take all of your other regular medications the morning of your test with enough water to get them down safely  PLEASE NOTIFY THE OFFICE AT LEAST 24 HOURS IN ADVANCE IF YOU ARE UNABLE TO KEEP YOUR APPOINTMENT.  [phone] AND  PLEASE NOTIFY NUCLEAR MEDICINE AT [facility] AT LEAST 24 HOURS IN ADVANCE IF YOU ARE UNABLE TO KEEP YOUR APPOINTMENT. [phone]  How to prepare for your Myoview test:  1. Do not eat or drink after midnight 2. No caffeine for 24 hours prior to test 3. No smoking 24 hours prior to test. 4. Your medication may be taken with water.  If your doctor stopped a medication because of this test, do not take that medication. 5. Ladies, please do not wear dresses.  Skirts or pants are appropriate. Please wear a  short sleeve shirt. 6. No perfume, cologne or lotion. 7. Wear comfortable walking shoes. No heels!    Follow-Up: At [facility], you and your health needs are our priority.  As part of our continuing mission to provide you with exceptional heart care, we have created designated Provider Care Teams.  These Care Teams include your primary Cardiologist (physician) and Advanced Practice Providers (APPs -  Physician Assistants and Nurse  Practitioners) who all work together to provide you with the care you need, when you need it.  We recommend signing up for the patient portal called "MyChart".  Sign up information is provided on this After Visit Summary.  MyChart is used to connect with patients for Virtual Visits (Telemedicine).  Patients are able to view lab/test results, encounter notes, upcoming appointments, etc.  Non-urgent messages can be sent to your provider as well.   To learn more about what you can do with MyChart, go to [email or web address].    Your next appointment:   6 week(s)/ after all testing is completed  The format for your next appointment:   In Person  Provider:   You may see Debbe Odea, MD or one of the following Advanced Practice Providers on your designated Care Team:    Nicolasa Ducking, NP  Eula Listen, PA-C  Marisue Ivan, PA-C  Cadence [place], [place]  Gillian Shields, NP    Other Instructions   Echocardiogram An echocardiogram is a test that uses sound waves (ultrasound) to produce images of the heart. Images from an echocardiogram can provide important information about:  Heart size and shape.  The size and thickness and movement of your heart's walls.  Heart muscle function and strength.  Heart valve function or if you have stenosis. Stenosis is when the heart valves are too narrow.  If blood is flowing backward through the heart valves (regurgitation).  A tumor or infectious growth around the heart valves.  Areas of heart muscle that are not working well because of poor blood flow or injury from a heart attack.  Aneurysm detection. An aneurysm is a weak or damaged part of an artery wall. The wall bulges out from the normal force of blood pumping through the body. Tell a health care provider about:  Any allergies you have.  All medicines you are taking, including vitamins, herbs, eye drops, creams, and over-the-counter medicines.  Any blood disorders you  have.  Any surgeries you have had.  Any medical conditions you have.  Whether you are pregnant or may be pregnant. What are the risks? Generally, this is a safe test. However, problems may occur, including an allergic reaction to dye (contrast) that may be used during the test. What happens before the test? No specific preparation is needed. You may eat and drink normally. What happens during the test?  You will take off your clothes from the waist up and put on a hospital gown.  Electrodes or electrocardiogram (ECG)patches may be placed on your chest. The electrodes or patches are then connected to a device that monitors your heart rate and rhythm.  You will lie down on a table for an ultrasound exam. A gel will be applied to your chest to help sound waves pass through your skin.  A handheld device, called a transducer, will be pressed against your chest and moved over your heart. The transducer produces sound waves that travel to your heart and bounce back (or "echo" back) to the transducer. These sound waves will be captured in real-time and  changed into images of your heart that can be viewed on a video monitor. The images will be recorded on a computer and reviewed by your health care provider.  You may be asked to change positions or hold your breath for a short time. This makes it easier to get different views or better views of your heart.  In some cases, you may receive contrast through an IV in one of your veins. This can improve the quality of the pictures from your heart. The procedure may vary among health care providers and hospitals.   What can I expect after the test? You may return to your normal, everyday life, including diet, activities, and medicines, unless your health care provider tells you not to do that. Follow these instructions at home:  It is up to you to get the results of your test. Ask your health care provider, or the department that is doing the test,  when your results will be ready.  Keep all follow-up visits. This is important. Summary  An echocardiogram is a test that uses sound waves (ultrasound) to produce images of the heart.  Images from an echocardiogram can provide important information about the size and shape of your heart, heart muscle function, heart valve function, and other possible heart problems.  You do not need to do anything to prepare before this test. You may eat and drink normally.  After the echocardiogram is completed, you may return to your normal, everyday life, unless your health care provider tells you not to do that. This information is not intended to replace advice given to you by your health care provider. Make sure you discuss any questions you have with your health care provider. Document Revised: 07/17/2020 Document Reviewed: 07/17/2020 Elsevier Patient Education  2021 Elsevier Inc.    Cardiac Nuclear Scan A cardiac nuclear scan is a test that is done to check the flow of blood to your heart. It is done when you are resting and when you are exercising. The test looks for problems such as:  Not enough blood reaching a portion of the heart.  The heart muscle not working as it should. You may need this test if:  You have heart disease.  You have had lab results that are not normal.  You have had heart surgery or a balloon procedure to open up blocked arteries (angioplasty).  You have chest pain.  You have shortness of breath. In this test, a special dye (tracer) is put into your bloodstream. The tracer will travel to your heart. A camera will then take pictures of your heart to see how the tracer moves through your heart. This test is usually done at a hospital and takes 2-4 hours. Tell a doctor about:  Any allergies you have.  All medicines you are taking, including vitamins, herbs, eye drops, creams, and over-the-counter medicines.  Any problems you or family members have had with  anesthetic medicines.  Any blood disorders you have.  Any surgeries you have had.  Any medical conditions you have.  Whether you are pregnant or may be pregnant. What are the risks? Generally, this is a safe test. However, problems may occur, such as:  Serious chest pain and heart attack. This is only a risk if the stress portion of the test is done.  Rapid heartbeat.  A feeling of warmth in your chest. This feeling usually does not last long.  Allergic reaction to the tracer. What happens before the test?  Ask your  doctor about changing or stopping your normal medicines. This is important.  Follow instructions from your doctor about what you cannot eat or drink.  Remove your jewelry on the day of the test. What happens during the test?  An IV tube will be inserted into one of your veins.  Your doctor will give you a small amount of tracer through the IV tube.  You will wait for 20-40 minutes while the tracer moves through your bloodstream.  Your heart will be monitored with an electrocardiogram (ECG).  You will lie down on an exam table.  Pictures of your heart will be taken for about 15-20 minutes.  You may also have a stress test. For this test, one of these things may be done: ? You will be asked to exercise on a treadmill or a stationary bike. ? You will be given medicines that will make your heart work harder. This is done if you are unable to exercise.  When blood flow to your heart has peaked, a tracer will again be given through the IV tube.  After 20-40 minutes, you will get back on the exam table. More pictures will be taken of your heart.  Depending on the tracer that is used, more pictures may need to be taken 3-4 hours later.  Your IV tube will be removed when the test is over. The test may vary among doctors and hospitals. What happens after the test?  Ask your doctor: ? Whether you can return to your normal schedule, including diet, activities,  and medicines. ? Whether you should drink more fluids. This will help to remove the tracer from your body. Drink enough fluid to keep your pee (urine) pale yellow.  Ask your doctor, or the department that is doing the test: ? When will my results be ready? ? How will I get my results? Summary  A cardiac nuclear scan is a test that is done to check the flow of blood to your heart.  Tell your doctor whether you are pregnant or may be pregnant.  Before the test, ask your doctor about changing or stopping your normal medicines. This is important.  Ask your doctor whether you can return to your normal activities. You may be asked to drink more fluids. This information is not intended to replace advice given to you by your health care provider. Make sure you discuss any questions you have with your health care provider. Document Revised: 03/16/2019 Document Reviewed: 05/10/2018 Elsevier Patient Education  2021 [employer].

## 2021-05-13 NOTE — Addendum Note (Signed)
Addended bySherri Rad C on: 05/13/2021 03:10 PM   Modules accepted: Orders

## 2021-05-13 NOTE — Addendum Note (Signed)
Addended by: Debbe Odea on: 05/13/2021 03:19 PM   Modules accepted: Orders

## 2021-05-13 NOTE — Addendum Note (Signed)
Addended by: Thayer Headings, Malikye Reppond L on: 05/13/2021 02:58 PM   Modules accepted: Orders

## 2021-05-15 ENCOUNTER — Ambulatory Visit (INDEPENDENT_AMBULATORY_CARE_PROVIDER_SITE_OTHER): Payer: Federal, State, Local not specified - PPO

## 2021-05-15 ENCOUNTER — Other Ambulatory Visit: Payer: Self-pay

## 2021-05-15 DIAGNOSIS — R072 Precordial pain: Secondary | ICD-10-CM | POA: Diagnosis not present

## 2021-05-15 LAB — ECHOCARDIOGRAM COMPLETE
AR max vel: 3.69 cm2
AV Area VTI: 4.22 cm2
AV Area mean vel: 3.91 cm2
AV Mean grad: 4 mmHg
AV Peak grad: 9.4 mmHg
Ao pk vel: 1.53 m/s
Area-P 1/2: 4.33 cm2
Calc EF: 57.6 %
P 1/2 time: 857 msec
S' Lateral: 3.3 cm
Single Plane A2C EF: 58.1 %
Single Plane A4C EF: 56.2 %

## 2021-05-17 ENCOUNTER — Telehealth: Payer: Self-pay

## 2021-05-17 NOTE — Telephone Encounter (Signed)
Left a VM for patient to call back for his echo results as seen below:  Normal Systolic Function mild aortic regurgitation, mild aortic root dilatation. overall okay echocardiogram, no findings to suggest etiology ofchest pain

## 2021-05-17 NOTE — Telephone Encounter (Signed)
Patient called back. I gave him his echo results and released the result note to his MyChart

## 2021-05-22 ENCOUNTER — Other Ambulatory Visit: Payer: Self-pay

## 2021-05-22 ENCOUNTER — Encounter
Admission: RE | Admit: 2021-05-22 | Discharge: 2021-05-22 | Disposition: A | Discharge status: Home / Self Care | Payer: Federal, State, Local not specified - PPO | Source: Ambulatory Visit | Attending: Cardiology | Admitting: Cardiology

## 2021-05-22 DIAGNOSIS — R072 Precordial pain: Secondary | ICD-10-CM | POA: Insufficient documentation

## 2021-05-22 LAB — NM MYOCAR MULTI W/SPECT W/WALL MOTION / EF
LV dias vol: 121 mL (ref 62–150)
LV sys vol: 33 mL
Peak HR: 96 {beats}/min
Percent HR: 61 %
Rest HR: 68 {beats}/min
SDS: 0
SRS: 1
SSS: 0
TID: 1.04

## 2021-05-22 MED ORDER — TECHNETIUM TC 99M TETROFOSMIN IV KIT
10.0000 | PACK | Freq: Once | INTRAVENOUS | Status: AC | PRN
  Administered 2021-05-22: 9.95 via INTRAVENOUS

## 2021-05-22 MED ORDER — REGADENOSON 0.4 MG/5ML IV SOLN
0.4000 mg | Freq: Once | INTRAVENOUS | Status: AC
  Administered 2021-05-22: 0.4 mg via INTRAVENOUS

## 2021-05-22 MED ORDER — TECHNETIUM TC 99M TETROFOSMIN IV KIT
30.4000 | PACK | Freq: Once | INTRAVENOUS | Status: AC | PRN
  Administered 2021-05-22: 30.4 via INTRAVENOUS

## 2021-06-24 ENCOUNTER — Other Ambulatory Visit: Payer: Self-pay

## 2021-06-24 ENCOUNTER — Ambulatory Visit: Payer: Federal, State, Local not specified - PPO | Admitting: Cardiology

## 2021-07-11 ENCOUNTER — Ambulatory Visit: Payer: Federal, State, Local not specified - PPO | Admitting: Nurse Practitioner

## 2021-07-12 ENCOUNTER — Other Ambulatory Visit (HOSPITAL_COMMUNITY): Payer: Self-pay | Admitting: Student

## 2021-07-12 ENCOUNTER — Other Ambulatory Visit: Payer: Self-pay | Admitting: Student

## 2021-07-12 DIAGNOSIS — G8929 Other chronic pain: Secondary | ICD-10-CM

## 2021-07-12 DIAGNOSIS — M7581 Other shoulder lesions, right shoulder: Secondary | ICD-10-CM

## 2021-07-13 ENCOUNTER — Ambulatory Visit
Admission: RE | Admit: 2021-07-13 | Discharge: 2021-07-13 | Disposition: A | Discharge status: Home / Self Care | Payer: Medicare Other | Source: Ambulatory Visit | Attending: Student | Admitting: Student

## 2021-07-13 ENCOUNTER — Other Ambulatory Visit: Payer: Self-pay

## 2021-07-13 DIAGNOSIS — G8929 Other chronic pain: Secondary | ICD-10-CM

## 2021-07-13 DIAGNOSIS — M7581 Other shoulder lesions, right shoulder: Secondary | ICD-10-CM | POA: Insufficient documentation

## 2021-07-13 DIAGNOSIS — M25511 Pain in right shoulder: Secondary | ICD-10-CM | POA: Diagnosis present

## 2021-07-22 ENCOUNTER — Other Ambulatory Visit: Payer: Self-pay

## 2021-07-22 ENCOUNTER — Encounter: Payer: Self-pay | Admitting: Cardiology

## 2021-07-22 ENCOUNTER — Ambulatory Visit (INDEPENDENT_AMBULATORY_CARE_PROVIDER_SITE_OTHER): Payer: Medicare Other | Admitting: Cardiology

## 2021-07-22 VITALS — BP 160/82 | HR 65 | Ht 69.0 in | Wt 210.0 lb

## 2021-07-22 DIAGNOSIS — Z01810 Encounter for preprocedural cardiovascular examination: Secondary | ICD-10-CM | POA: Diagnosis not present

## 2021-07-22 DIAGNOSIS — Z01818 Encounter for other preprocedural examination: Secondary | ICD-10-CM

## 2021-07-22 DIAGNOSIS — I10 Essential (primary) hypertension: Secondary | ICD-10-CM

## 2021-07-22 DIAGNOSIS — E7800 Pure hypercholesterolemia, unspecified: Secondary | ICD-10-CM

## 2021-07-22 DIAGNOSIS — R42 Dizziness and giddiness: Secondary | ICD-10-CM

## 2021-07-22 DIAGNOSIS — R072 Precordial pain: Secondary | ICD-10-CM | POA: Diagnosis not present

## 2021-07-22 MED ORDER — LOSARTAN POTASSIUM-HCTZ 100-25 MG PO TABS
1.0000 | ORAL_TABLET | Freq: Every day | ORAL | 5 refills | Status: DC
Start: 2021-07-22 — End: 2022-01-23

## 2021-07-22 NOTE — Progress Notes (Signed)
Cardiology Office Note:    Date:  07/22/2021   ID:  Ricky Figueroa, DOB Oct 05, 1956, MRN [id number]  PCP:  Enid Baas, MD   [facility] Providers Cardiologist:  None     Referring MD: Enid Baas, MD   Chief Complaint  Patient presents with   Other    6 week follow up. Meds reviewed verbally with patient.      History of Present Illness:    Ricky Figueroa is a 65 y.o. male with a hx of hypertension, diabetes, hyperlipidemia who presents for follow-up.  He was last seen due to chest pain and dizziness.  His blood pressure was noted to be low, amlodipine was stopped.  Hyzaar was decreased.  His dizziness is overall improved, denies further episodes of chest pain.  He was doing renovations in his kitchen, injured his right shoulder, MRI showed complete tear of rotator cuff tendons.  Surgery is being planned.   Past Medical History:  Diagnosis Date   Diabetes mellitus without complication (HCC)    Hypertension     History reviewed. No pertinent surgical history.  Current Medications: Current Meds  Medication Sig   allopurinol (ZYLOPRIM) 300 MG tablet Take 450 mg by mouth daily.   aspirin 81 MG chewable tablet Chew 81 mg by mouth daily.   atorvastatin (LIPITOR) 40 MG tablet Take 40 mg by mouth daily.   DULoxetine (CYMBALTA) 60 MG capsule Take 60 mg by mouth 2 (two) times daily.   folic acid (FOLVITE) 1 MG tablet Take 1 mg by mouth daily.   metFORMIN (GLUCOPHAGE) 500 MG tablet Take 1 tablet by mouth 2 (two) times daily.   metoprolol tartrate (LOPRESSOR) 25 MG tablet Take 25 mg by mouth 2 (two) times daily.   RABEprazole (ACIPHEX) 20 MG tablet Take 20 mg by mouth daily.   [DISCONTINUED] losartan-hydrochlorothiazide (HYZAAR) 100-25 MG tablet Take 0.5 tablet (50-12.5 mg) once daily     Allergies:   Iodine and Oxycodone-acetaminophen   Social History   Socioeconomic History   Marital status: Married    Spouse name: Not on file   Number of children:  Not on file   Years of education: Not on file   Highest education level: Not on file  Occupational History   Not on file  Tobacco Use   Smoking status: Never   Smokeless tobacco: Never  Substance and Sexual Activity   Alcohol use: Yes    Comment: occasional   Drug use: Never   Sexual activity: Not on file  Other Topics Concern   Not on file  Social History Narrative   Not on file   Social Determinants of Health   Financial Resource Strain: Not on file  Food Insecurity: Not on file  Transportation Needs: Not on file  Physical Activity: Not on file  Stress: Not on file  Social Connections: Not on file     Family History: The patient's family history is not on file.  ROS:   Please see the history of present illness.     All other systems reviewed and are negative.  EKGs/Labs/Other Studies Reviewed:    The following studies were reviewed today:   EKG:  EKG is  ordered today.  The ekg ordered today demonstrates sinus bradycardia, heart rate 54  Recent Labs: 02/04/2021: ALT 158; BUN 24; Creatinine, Ser 1.01; Hemoglobin 11.2; Platelets 249; Potassium 3.7; Sodium 133  Recent Lipid Panel No results found for: CHOL, TRIG, HDL, CHOLHDL, VLDL, LDLCALC, LDLDIRECT   Risk Assessment/Calculations:  Physical Exam:    VS:  BP (!) 160/82 (BP Location: Left Arm, Patient Position: Sitting, Cuff Size: Normal)   Pulse 65   Ht 5\' 9"  (1.753 m)   Wt 210 lb (95.3 kg)   SpO2 98%   BMI 31.01 kg/m     Wt Readings from Last 3 Encounters:  07/22/21 210 lb (95.3 kg)  05/13/21 213 lb (96.6 kg)  02/04/21 217 lb (98.4 kg)     GEN:  Well nourished, well developed in no acute distress HEENT: Normal NECK: No JVD; No carotid bruits LYMPHATICS: No lymphadenopathy CARDIAC: RRR, no murmurs, rubs, gallops RESPIRATORY:  Clear to auscultation without rales, wheezing or rhonchi  ABDOMEN: Soft, non-tender, non-distended MUSCULOSKELETAL:  No edema; tenderness with movement of right  arm. SKIN: Warm and dry NEUROLOGIC:  Alert and oriented x 3 PSYCHIATRIC:  Normal affect   ASSESSMENT:    1. Precordial pain   2. Dizziness   3. Primary hypertension   4. Pure hypercholesterolemia   5. Pre-op evaluation     PLAN:    In order of problems listed above:  Chest pain, risk factors hypertension, hyperlipidemia, family history of CAD, diabetes.  Echocardiogram showed normal systolic function, EF 60 to 65%, mild aortic root dilatation, mild AI.  Lexiscan Myoview with no evidence for ischemia.  Chest pain overall improved.  Patient made aware of results, reassured. Dizziness, overall improved, patient now hypertensive.  Increase Hyzaar to 1 tab daily. Hypertension, BP elevated today.  Increase Hyzaar to 1 tab daily.  Continue Lopressor 25 mg twice daily. Hyperlipidemia, continue statin. Right shoulder tear, okay to proceed with surgery from a cardiac perspective.  Echo with preserved EF, Lexiscan with no evidence of ischemia.  Follow-up in 6 months.     Medication Adjustments/Labs and Tests Ordered: Current medicines are reviewed at length with the patient today.  Concerns regarding medicines are outlined above.  No orders of the defined types were placed in this encounter.  Meds ordered this encounter  Medications   losartan-hydrochlorothiazide (HYZAAR) 100-25 MG tablet    Sig: Take 1 tablet by mouth daily.    Dispense:  30 tablet    Refill:  5     Patient Instructions  Medication Instructions:   INCREASE your Hyzaar to one full tablet daily.   *If you need a refill on your cardiac medications before your next appointment, please call your pharmacy*   Lab Work: None ordered If you have labs (blood work) drawn today and your tests are completely normal, you will receive your results only by: MyChart Message (if you have MyChart) OR A paper copy in the mail If you have any lab test that is abnormal or we need to change your treatment, we will call you to  review the results.   Testing/Procedures:  None ordered   Follow-Up: At [facility], you and your health needs are our priority.  As part of our continuing mission to provide you with exceptional heart care, we have created designated Provider Care Teams.  These Care Teams include your primary Cardiologist (physician) and Advanced Practice Providers (APPs -  Physician Assistants and Nurse Practitioners) who all work together to provide you with the care you need, when you need it.  We recommend signing up for the patient portal called "MyChart".  Sign up information is provided on this After Visit Summary.  MyChart is used to connect with patients for Virtual Visits (Telemedicine).  Patients are able to view lab/test results, encounter notes, upcoming  appointments, etc.  Non-urgent messages can be sent to your provider as well.   To learn more about what you can do with MyChart, go to [email or web address].    Your next appointment:   6 month(s)  The format for your next appointment:   In Person  Provider:   You may see Dr. Azucena Cecil or one of the following Advanced Practice Providers on your designated Care Team:   Nicolasa Ducking, NP Eula Listen, PA-C Marisue Ivan, PA-C Cadence [place], [place]   Other Instructions     Signed, Debbe Odea, MD  07/22/2021 11:41 AM    [facility]

## 2021-07-22 NOTE — Patient Instructions (Signed)
Medication Instructions:   INCREASE your Hyzaar to one full tablet daily.   *If you need a refill on your cardiac medications before your next appointment, please call your pharmacy*   Lab Work: None ordered If you have labs (blood work) drawn today and your tests are completely normal, you will receive your results only by: MyChart Message (if you have MyChart) OR A paper copy in the mail If you have any lab test that is abnormal or we need to change your treatment, we will call you to review the results.   Testing/Procedures:  None ordered   Follow-Up: At [facility], you and your health needs are our priority.  As part of our continuing mission to provide you with exceptional heart care, we have created designated Provider Care Teams.  These Care Teams include your primary Cardiologist (physician) and Advanced Practice Providers (APPs -  Physician Assistants and Nurse Practitioners) who all work together to provide you with the care you need, when you need it.  We recommend signing up for the patient portal called "MyChart".  Sign up information is provided on this After Visit Summary.  MyChart is used to connect with patients for Virtual Visits (Telemedicine).  Patients are able to view lab/test results, encounter notes, upcoming appointments, etc.  Non-urgent messages can be sent to your provider as well.   To learn more about what you can do with MyChart, go to [email or web address].    Your next appointment:   6 month(s)  The format for your next appointment:   In Person  Provider:   You may see Dr. Azucena Cecil or one of the following Advanced Practice Providers on your designated Care Team:   Nicolasa Ducking, NP Eula Listen, PA-C Marisue Ivan, PA-C [place]ence [place], [place]   Other Instructions

## 2022-01-23 ENCOUNTER — Encounter: Payer: Self-pay | Admitting: Cardiology

## 2022-01-23 ENCOUNTER — Ambulatory Visit (INDEPENDENT_AMBULATORY_CARE_PROVIDER_SITE_OTHER): Payer: Medicare Other | Admitting: Cardiology

## 2022-01-23 ENCOUNTER — Other Ambulatory Visit: Payer: Self-pay

## 2022-01-23 VITALS — BP 168/94 | HR 66 | Ht 69.0 in | Wt 230.0 lb

## 2022-01-23 DIAGNOSIS — I10 Essential (primary) hypertension: Secondary | ICD-10-CM | POA: Diagnosis not present

## 2022-01-23 DIAGNOSIS — E7800 Pure hypercholesterolemia, unspecified: Secondary | ICD-10-CM

## 2022-01-23 DIAGNOSIS — R5383 Other fatigue: Secondary | ICD-10-CM

## 2022-01-23 NOTE — Patient Instructions (Signed)
Medication Instructions:  ° °Your physician recommends that you continue on your current medications as directed. Please refer to the Current Medication list given to you today. ° °*If you need a refill on your cardiac medications before your next appointment, please call your pharmacy* ° ° °Lab Work: °None ordered °If you have labs (blood work) drawn today and your tests are completely normal, you will receive your results only by: °MyChart Message (if you have MyChart) OR °A paper copy in the mail °If you have any lab test that is abnormal or we need to change your treatment, we will call you to review the results. ° ° °Testing/Procedures: °None ordered ° ° °Follow-Up: °At [facility], you and your health needs are our priority.  As part of our continuing mission to provide you with exceptional heart care, we have created designated Provider Care Teams.  These Care Teams include your primary Cardiologist (physician) and Advanced Practice Providers (APPs -  Physician Assistants and Nurse Practitioners) who all work together to provide you with the care you need, when you need it. ° °We recommend signing up for the patient portal called "MyChart".  Sign up information is provided on this After Visit Summary.  MyChart is used to connect with patients for Virtual Visits (Telemedicine).  Patients are able to view lab/test results, encounter notes, upcoming appointments, etc.  Non-urgent messages can be sent to your provider as well.   °To learn more about what you can do with MyChart, go to [email or web address].   ° °Your next appointment:   °6-12 month(s) ° °The format for your next appointment:   °In Person ° °Provider:   °You may see Brian Agbor-Etang, MD or one of the following Advanced Practice Providers on your designated Care Team:   °Christopher Berge, NP °Ryan Dunn, PA-C °Cadence Furth, PA-C  ° ° °Other Instructions ° ° °

## 2022-01-23 NOTE — Progress Notes (Signed)
Cardiology Office Note:    Date:  01/23/2022   ID:  Ricky Figueroa [birth year], MRN [id number]  PCP:  Enid Baas, MD   [facility] Providers Cardiologist:  None     Referring MD: Enid Baas, MD   Chief Complaint  Patient presents with   Other    6 month follow up -- patient c/o low energy. Meds reviewed verbally with patient.     History of Present Illness:    Ricky Figueroa is a 66 y.o. male with a hx of hypertension, diabetes, hyperlipidemia who presents for follow-up.    Being seen for hypertension.  BP meds were previously adjusted, currently takes losartan 100 mg daily, HCTZ 12.5 mg daily.  Blood pressures at home are well controlled with systolics in the 120s to 130s.  He had right shoulder surgery 3 months ago, currently undergoing physical therapy.  He states feeling fatigue which she attributes to weight gain and being sedentary after his surgery.  He is working on his conditioning.    Prior notes Echo 05/2021 EF 60 to 65%, mild aortic regurgitation, mild aortic root dilatation Lexiscan Myoview 05/2021 no evidence for ischemia.   Past Medical History:  Diagnosis Date   Diabetes mellitus without complication (HCC)    Hypertension     History reviewed. No pertinent surgical history.  Current Medications: Current Meds  Medication Sig   allopurinol (ZYLOPRIM) 300 MG tablet Take 450 mg by mouth daily.   aspirin 81 MG chewable tablet Chew 81 mg by mouth daily.   atorvastatin (LIPITOR) 40 MG tablet Take 40 mg by mouth daily.   DULoxetine (CYMBALTA) 60 MG capsule Take 60 mg by mouth 2 (two) times daily.   Ferrous Gluconate (IRON 27 PO) Take by mouth.   hydrochlorothiazide (MICROZIDE) 12.5 MG capsule Take 12.5 mg by mouth daily.   isosorbide mononitrate (IMDUR) 30 MG 24 hr tablet Take 30 mg by mouth daily.   losartan (COZAAR) 100 MG tablet Take 100 mg by mouth daily.   Magnesium 250 MG TABS Take 250 mg by mouth daily.   melatonin 5 MG  TABS Take 5 mg by mouth at bedtime.   metFORMIN (GLUCOPHAGE) 500 MG tablet Take 1 tablet by mouth 2 (two) times daily.   methocarbamol (ROBAXIN) 500 MG tablet Take 500 mg by mouth 3 (three) times daily.   OZEMPIC, 0.25 OR 0.5 MG/DOSE, 2 MG/1.5ML SOPN Inject 0.5 mg into the skin once a week.   RABEprazole (ACIPHEX) 20 MG tablet Take 20 mg by mouth daily.     Allergies:   Iodine and Oxycodone-acetaminophen   Social History   Socioeconomic History   Marital status: Married    Spouse name: Not on file   Number of children: Not on file   Years of education: Not on file   Highest education level: Not on file  Occupational History   Not on file  Tobacco Use   Smoking status: Never   Smokeless tobacco: Never  Substance and Sexual Activity   Alcohol use: Yes    Comment: occasional   Drug use: Never   Sexual activity: Not on file  Other Topics Concern   Not on file  Social History Narrative   Not on file   Social Determinants of Health   Financial Resource Strain: Not on file  Food Insecurity: Not on file  Transportation Needs: Not on file  Physical Activity: Not on file  Stress: Not on file  Social Connections: Not on file  Family History: The patient's family history is not on file.  ROS:   Please see the history of present illness.     All other systems reviewed and are negative.  EKGs/Labs/Other Studies Reviewed:    The following studies were reviewed today:   EKG:  EKG is  ordered today.  The ekg ordered today demonstrates normal sinus rhythm, normal ECG  Recent Labs: 02/04/2021: ALT 158; BUN 24; Creatinine, Ser 1.01; Hemoglobin 11.2; Platelets 249; Potassium 3.7; Sodium 133  Recent Lipid Panel No results found for: CHOL, TRIG, HDL, CHOLHDL, VLDL, LDLCALC, LDLDIRECT   Risk Assessment/Calculations:      Physical Exam:    VS:  BP (!) 168/94 (BP Location: Left Arm, Patient Position: Sitting, Cuff Size: Normal)    Pulse 66    Ht 5\' 9"  (1.753 m)    Wt 230  lb (104.3 kg)    SpO2 97%    BMI 33.97 kg/m     Wt Readings from Last 3 Encounters:  01/23/22 230 lb (104.3 kg)  07/22/21 210 lb (95.3 kg)  05/13/21 213 lb (96.6 kg)     GEN:  Well nourished, well developed in no acute distress HEENT: Normal NECK: No JVD; No carotid bruits LYMPHATICS: No lymphadenopathy CARDIAC: RRR, no murmurs, rubs, gallops RESPIRATORY:  Clear to auscultation without rales, wheezing or rhonchi  ABDOMEN: Soft, non-tender, non-distended MUSCULOSKELETAL:  No edema; tenderness with movement of right arm. SKIN: Warm and dry NEUROLOGIC:  Alert and oriented x 3 PSYCHIATRIC:  Normal affect   ASSESSMENT:    1. Primary hypertension   2. Pure hypercholesterolemia   3. Fatigue, unspecified type    PLAN:    In order of problems listed above:  Hypertension, BP elevated today, controlled at home.  Has a component of whitecoat syndrome.  Continue losartan 100 mg daily, HCTZ 12.5 mg daily. Hyperlipidemia, continue Lipitor 40 mg daily. Fatigue, likely from deconditioning.  Previous echo with preserved EF, Lexiscan Myoview with no ischemia.  Graduated exercising as tolerated advised.  Follow-up in 6-12 months.   Medication Adjustments/Labs and Tests Ordered: Current medicines are reviewed at length with the patient today.  Concerns regarding medicines are outlined above.  Orders Placed This Encounter  Procedures   EKG 12-Lead    No orders of the defined types were placed in this encounter.    Patient Instructions  Medication Instructions:  Your physician recommends that you continue on your current medications as directed. Please refer to the Current Medication list given to you today.  *If you need a refill on your cardiac medications before your next appointment, please call your pharmacy*   Lab Work: None ordered If you have labs (blood work) drawn today and your tests are completely normal, you will receive your results only by: MyChart Message (if you  have MyChart) OR A paper copy in the mail If you have any lab test that is abnormal or we need to change your treatment, we will call you to review the results.   Testing/Procedures: None ordered   Follow-Up: At [facility], you and your health needs are our priority.  As part of our continuing mission to provide you with exceptional heart care, we have created designated Provider Care Teams.  These Care Teams include your primary Cardiologist (physician) and Advanced Practice Providers (APPs -  Physician Assistants and Nurse Practitioners) who all work together to provide you with the care you need, when you need it.  We recommend signing up for the patient portal  called "MyChart".  Sign up information is provided on this After Visit Summary.  MyChart is used to connect with patients for Virtual Visits (Telemedicine).  Patients are able to view lab/test results, encounter notes, upcoming appointments, etc.  Non-urgent messages can be sent to your provider as well.   To learn more about what you can do with MyChart, go to [email or web address].    Your next appointment:   6-12 months   The format for your next appointment:   In Person  Provider:   You may see Debbe Odea, MD or one of the following Advanced Practice Providers on your designated Care Team:   Nicolasa Ducking, NP Eula Listen, PA-C Cadence Fransico Michael, [place]    Other Instructions      Signed, Debbe Odea, MD  01/23/2022 12:15 PM    [facility]

## 2022-02-18 IMAGING — CT CT HEAD W/O CM
3 of 4 series · 12 of 47 positions shown, 14 images · non-contrast
Comparison: None.

CLINICAL DATA: Headache and dizziness.  Cervicalgia 34

EXAM:
CT HEAD WITHOUT CONTRAST
CT CERVICAL SPINE WITHOUT CONTRAST
TECHNIQUE: Multidetector CT imaging of the head and cervical spine was
performed following the standard protocol without intravenous
contrast. Multiplanar CT image reconstructions of the cervical spine
were also generated.

[Series 3: axial st head 5.00 ax · axial · 0.36mm/px · z∈[-483,-384]mm · 6 of 30 slices shown, 8 images]
[im 5/30  brain]
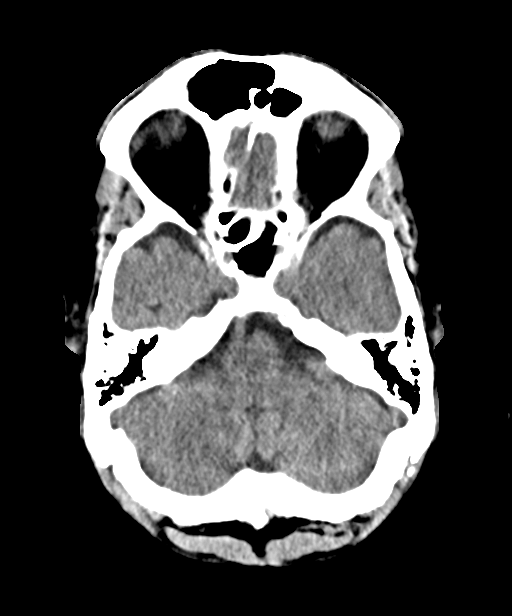
[im 5/30  bone]
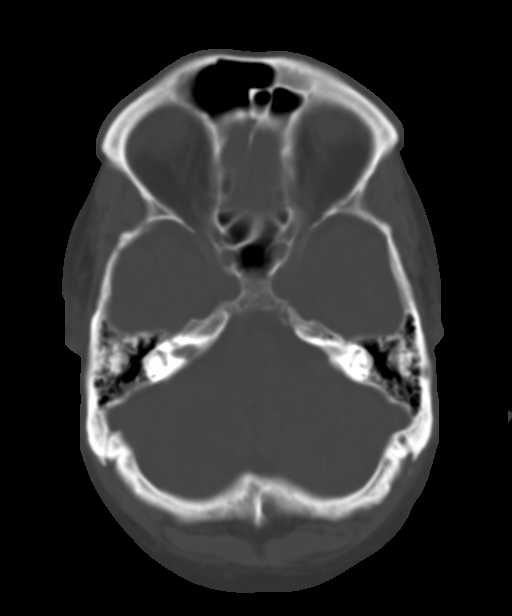
[im 9/30  brain]
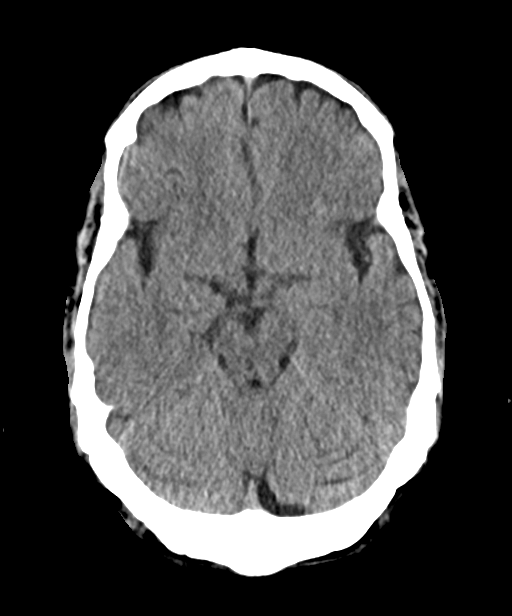
[im 13/30  brain]
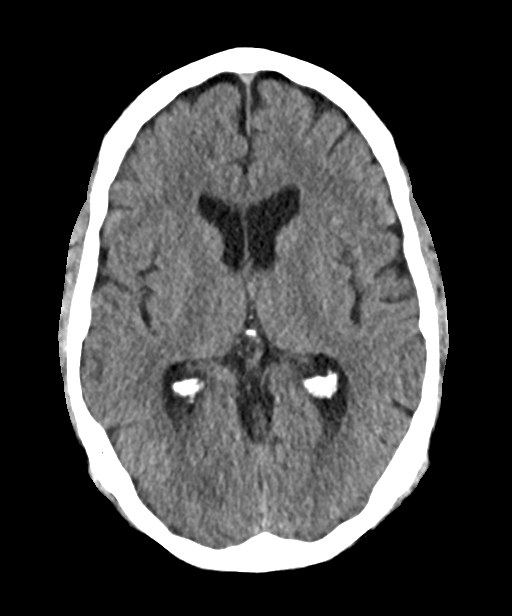
[im 17/30  brain]
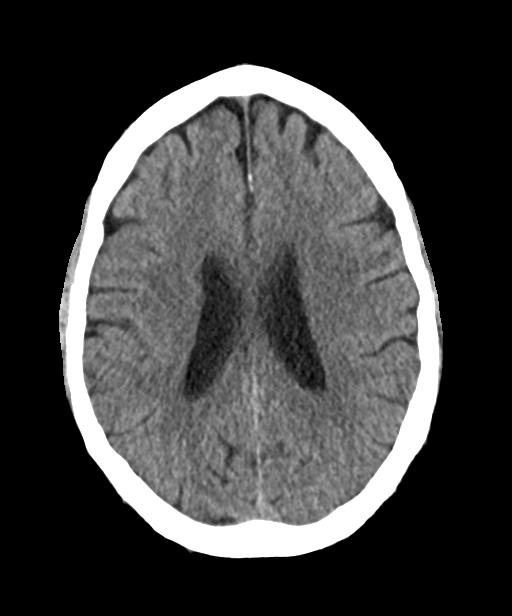
[im 21/30  brain]
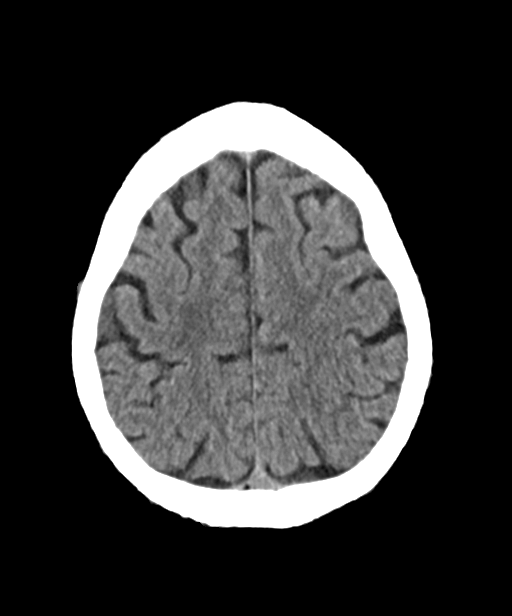
[im 21/30  bone]
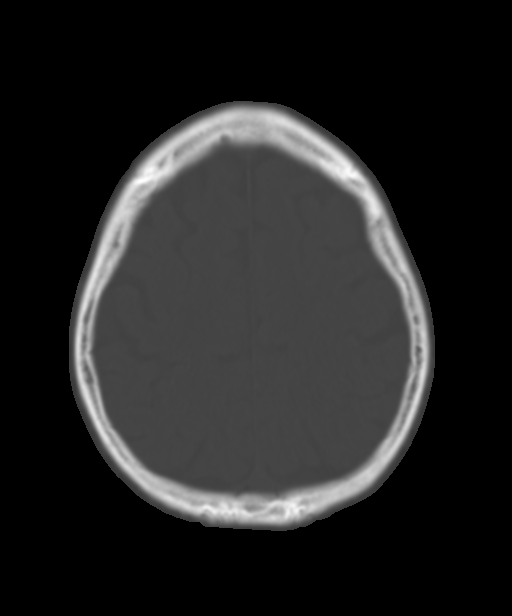
[im 25/30  brain]
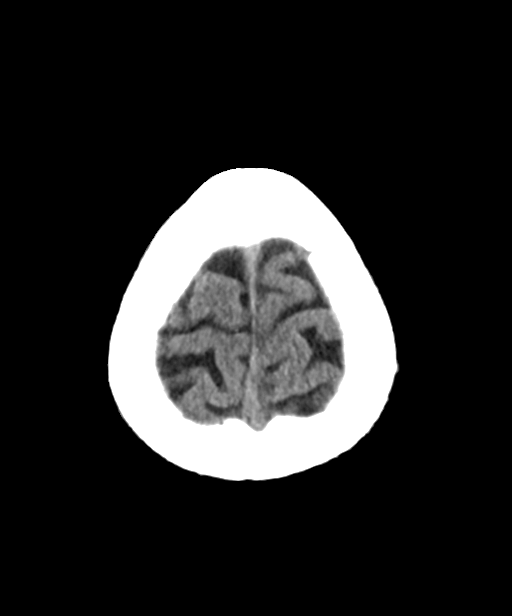

[Series 7: coronals head 3.00 cor · coronal · 0.30mm/px · 3 of 74 slices shown]
[im 25/74  brain]
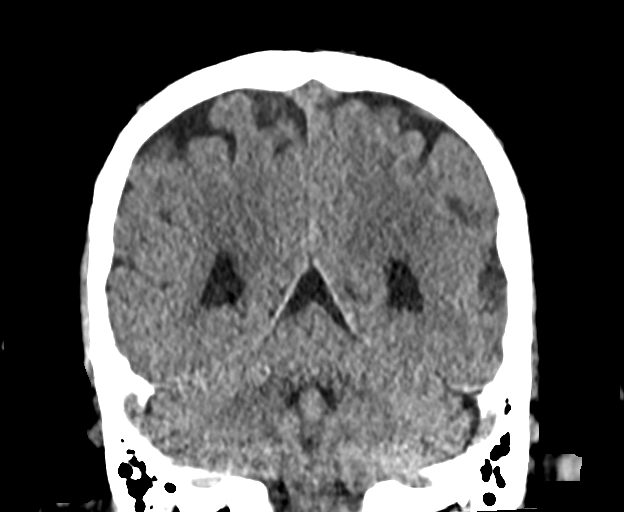
[im 33/74  brain]
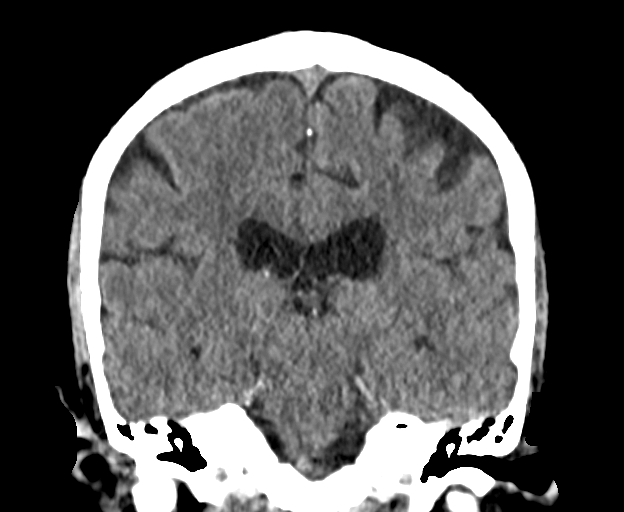
[im 41/74  brain]
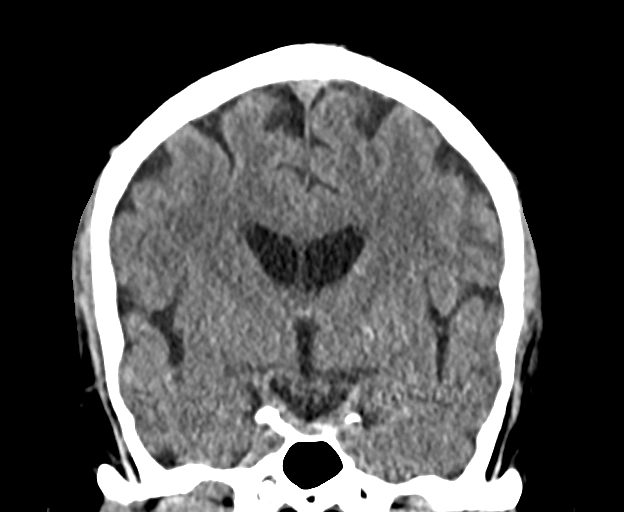

[Series 9: sagittals head 3.00 sag · sagittal · 0.30mm/px · 3 of 61 slices shown]
[im 21/61  brain]
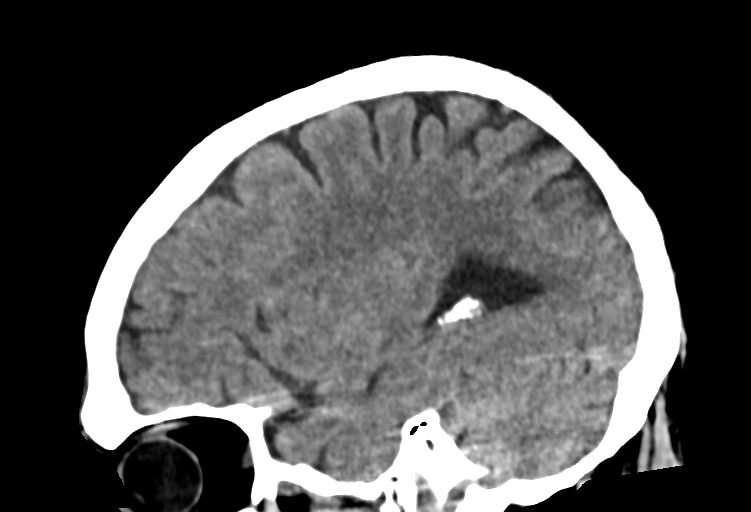
[im 31/61  brain]
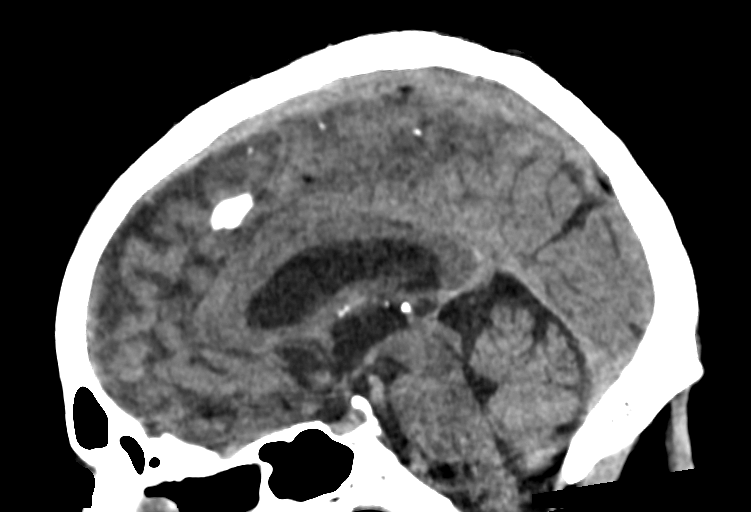
[im 41/61  brain]
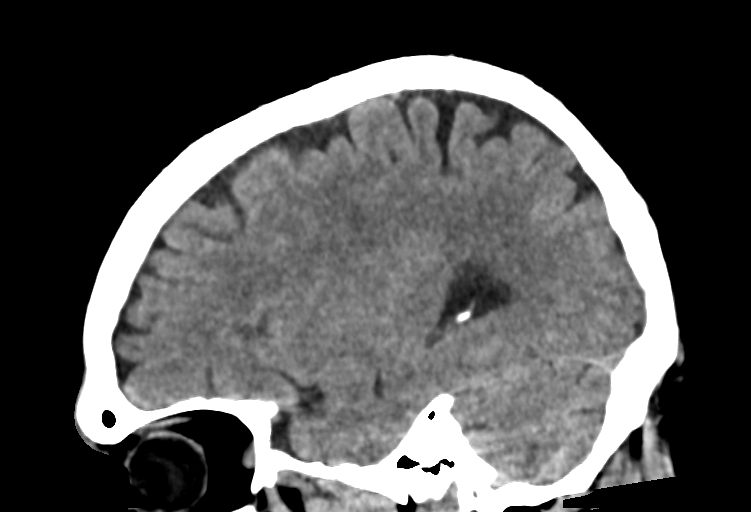

[12 of 47 positions shown; findings below may reference images not displayed]

FINDINGS: CT HEAD FINDINGS

Brain: Ventricles and sulci within normal limits with respect to
size and configuration. There is no appreciable intracranial mass,
hemorrhage, extra-axial fluid collection, or midline shift. The
brain parenchyma appears unremarkable. No acute infarct evident.

Vascular: No hyperdense vessels. There is slight calcification in
each carotid siphon region.

Skull: The bony calvarium appears intact.

Sinuses/Orbits: There is mucosal thickening along the lateral
superior right maxillary antrum as well as mucosal thickening in
multiple ethmoid air cells. There is a retention cyst more
inferiorly in the right maxillary antrum, seen on cervical CT.
Visualized orbits appear symmetric bilaterally.

Other: Mastoid air cells are clear.

CT CERVICAL SPINE FINDINGS

Alignment: There is no appreciable spondylolisthesis.

Skull base and vertebrae: Patient is status post screw and plate
fixation anteriorly at C3 and C4 with an apparent disc spacer at
C3-4. There is apparent postoperative change from C3-C7 with partial
to complete ankylosis at C3-4, C4-5, C5-6, and C6-7. Skull base and
craniocervical junction regions appear normal. No acute fracture
evident. No blastic or lytic bone lesions are evident.

Soft tissues and spinal canal: Prevertebral soft tissues and
predental space regions are normal. No appreciable cord or canal
hematoma. No paraspinous lesions are evident.

Disc levels: Extensive postoperative changes noted above with
ankylosis at C3-4, C4-5, C5-6, and C6-7.

At C2-3, there is moderate facet hypertrophy bilaterally. No nerve
root edema or effacement. No disc extrusion or stenosis.

At C3-4, there is facet hypertrophy bilaterally, moderate. There is
exit foraminal narrowing bilaterally at this level with impression
on exiting nerve roots bilaterally, somewhat more severe on the left
than on the right. No frank disc extrusion or stenosis.

At C4-5, there is moderate facet hypertrophy bilaterally. There is
bony hypertrophy on the left at C4-5 with impression on the exiting
nerve root. No disc extrusion or stenosis.

At C5-6, there is moderate facet hypertrophy bilaterally. There is
mild impression on the exiting nerve roots, more severe on the left
than on the right. No frank disc extrusion or stenosis.

At C6-7, there is moderate facet hypertrophy bilaterally with mild
impression on the proximal left C6-7 nerve root. No disc extrusion
or stenosis.

At C7-T1, there is slightly milder facet hypertrophy bilaterally. No
nerve root edema or effacement. No disc extrusion or stenosis.

Note that there are anterior osteophytes at all levels in the lower
cervical and upper thoracic regions.

Upper chest: Visualized upper lung regions are clear.

Other: Calcification noted in each carotid artery.
IMPRESSION: CT head: Normal appearing brain parenchyma. No mass or hemorrhage.
Slight arterial vascular calcification noted. Scattered foci of
paranasal sinus disease.

CT cervical spine:

No fracture or spondylolisthesis.

Anterior screw and plate fixation at C3 and C4 with support hardware
intact. Disc spacer at C3-4.

Marked disc space narrowing with partial to complete ankylosis at
C3-4, C4-5, C5-6, and C6-7.

Facet hypertrophy to varying degrees at all levels. Exit foraminal
narrowing with impression on exiting nerve roots at several levels,
tending to be more severe on the left than on the right at several
levels. No frank disc extrusion or stenosis.

Calcification in each carotid artery noted.

## 2022-09-18 ENCOUNTER — Other Ambulatory Visit: Payer: Self-pay

## 2022-09-18 DIAGNOSIS — D649 Anemia, unspecified: Secondary | ICD-10-CM | POA: Insufficient documentation

## 2022-09-18 DIAGNOSIS — R42 Dizziness and giddiness: Secondary | ICD-10-CM | POA: Diagnosis present

## 2022-09-18 DIAGNOSIS — I959 Hypotension, unspecified: Secondary | ICD-10-CM | POA: Insufficient documentation

## 2022-09-18 DIAGNOSIS — Z79899 Other long term (current) drug therapy: Secondary | ICD-10-CM | POA: Insufficient documentation

## 2022-09-18 DIAGNOSIS — I10 Essential (primary) hypertension: Secondary | ICD-10-CM | POA: Diagnosis not present

## 2022-09-18 DIAGNOSIS — E119 Type 2 diabetes mellitus without complications: Secondary | ICD-10-CM | POA: Diagnosis not present

## 2022-09-18 DIAGNOSIS — Z7982 Long term (current) use of aspirin: Secondary | ICD-10-CM | POA: Insufficient documentation

## 2022-09-18 DIAGNOSIS — Z7984 Long term (current) use of oral hypoglycemic drugs: Secondary | ICD-10-CM | POA: Insufficient documentation

## 2022-09-18 LAB — CBC WITH DIFFERENTIAL/PLATELET
Abs Immature Granulocytes: 0.01 10*3/uL (ref 0.00–0.07)
Basophils Absolute: 0 10*3/uL (ref 0.0–0.1)
Basophils Relative: 1 %
Eosinophils Absolute: 0.3 10*3/uL (ref 0.0–0.5)
Eosinophils Relative: 5 %
HCT: 35.1 % — ABNORMAL LOW (ref 39.0–52.0)
Hemoglobin: 11.7 g/dL — ABNORMAL LOW (ref 13.0–17.0)
Immature Granulocytes: 0 %
Lymphocytes Relative: 33 %
Lymphs Abs: 2.4 10*3/uL (ref 0.7–4.0)
MCH: 31.5 pg (ref 26.0–34.0)
MCHC: 33.3 g/dL (ref 30.0–36.0)
MCV: 94.4 fL (ref 80.0–100.0)
Monocytes Absolute: 0.7 10*3/uL (ref 0.1–1.0)
Monocytes Relative: 10 %
Neutro Abs: 3.7 10*3/uL (ref 1.7–7.7)
Neutrophils Relative %: 51 %
Platelets: 287 10*3/uL (ref 150–400)
RBC: 3.72 MIL/uL — ABNORMAL LOW (ref 4.22–5.81)
RDW: 13.4 % (ref 11.5–15.5)
WBC: 7.1 10*3/uL (ref 4.0–10.5)
nRBC: 0 % (ref 0.0–0.2)

## 2022-09-18 LAB — COMPREHENSIVE METABOLIC PANEL
ALT: 29 U/L (ref 0–44)
AST: 41 U/L (ref 15–41)
Albumin: 3.9 g/dL (ref 3.5–5.0)
Alkaline Phosphatase: 51 U/L (ref 38–126)
Anion gap: 8 (ref 5–15)
BUN: 21 mg/dL (ref 8–23)
CO2: 27 mmol/L (ref 22–32)
Calcium: 9.1 mg/dL (ref 8.9–10.3)
Chloride: 104 mmol/L (ref 98–111)
Creatinine, Ser: 1.09 mg/dL (ref 0.61–1.24)
GFR, Estimated: >60 mL/min (ref >60)
Glucose, Bld: 98 mg/dL (ref 70–99)
Potassium: 3.9 mmol/L (ref 3.5–5.1)
Sodium: 139 mmol/L (ref 135–145)
Total Bilirubin: 1.2 mg/dL (ref 0.3–1.2)
Total Protein: 6.7 g/dL (ref 6.5–8.1)

## 2022-09-18 LAB — URINALYSIS, ROUTINE W REFLEX MICROSCOPIC
Bilirubin Urine: NEGATIVE
Glucose, UA: NEGATIVE mg/dL
Hgb urine dipstick: NEGATIVE
Ketones, ur: NEGATIVE mg/dL
Leukocytes,Ua: NEGATIVE
Nitrite: NEGATIVE
Protein, ur: NEGATIVE mg/dL
Specific Gravity, Urine: 1.017 (ref 1.005–1.030)
pH: 7 (ref 5.0–8.0)

## 2022-09-18 LAB — TROPONIN I (HIGH SENSITIVITY): Troponin I (High Sensitivity): 6 ng/L (ref <18)

## 2022-09-18 NOTE — ED Triage Notes (Signed)
Brought in by [facility] with c/o hypotension. BP at home reportedly 60/30. Pt received " fluids" from EMS. But unable to elaborate, and triage nurse did not receive report from EMS as to treatment.  Pt c/o headache at this time but denies all other sx.

## 2022-09-19 ENCOUNTER — Emergency Department
Admission: EM | Admit: 2022-09-19 | Discharge: 2022-09-19 | Disposition: A | Discharge status: Home / Self Care | Payer: Medicare Other | Attending: Emergency Medicine | Admitting: Emergency Medicine

## 2022-09-19 DIAGNOSIS — I959 Hypotension, unspecified: Secondary | ICD-10-CM | POA: Diagnosis not present

## 2022-09-19 LAB — PROCALCITONIN: Procalcitonin: <0.1 ng/mL

## 2022-09-19 LAB — LACTIC ACID, PLASMA: Lactic Acid, Venous: 1.3 mmol/L (ref 0.5–1.9)

## 2022-09-19 LAB — TROPONIN I (HIGH SENSITIVITY): Troponin I (High Sensitivity): 4 ng/L (ref <18)

## 2022-09-19 MED ORDER — SODIUM CHLORIDE 0.9 % IV BOLUS (SEPSIS)
1000.0000 mL | Freq: Once | INTRAVENOUS | Status: AC
  Administered 2022-09-19: 1000 mL via INTRAVENOUS

## 2022-09-19 NOTE — ED Provider Notes (Signed)
[facility] Provider Note    Event Date/Time   First MD Initiated Contact with Patient 09/19/22 0102     (approximate)   History   Hypotension    HPI  Ricky Figueroa is a 66 y.o. male with history of hypertension on losartan/HCTZ, metoprolol who presents to the emergency department for lightheadedness.  Symptoms ongoing intermittently for several weeks but worsened tonight.  Blood pressure at home was in the 70s/30s.  Symptoms worse with standing.  No fevers, cough, chest pain, shortness of breath, vomiting, diarrhea.  States his doctor has recently decreased the doses of some of his medications.  No new medications other than Neurontin for chronic neck pain.  Has come off of Cymbalta recently due to vivid dreams.  States his blood pressure has never been this low before.  Received IV fluids with EMS and is starting to feel little bit better.  History provided by patient and wife.    Past Medical History:  Diagnosis Date   Diabetes mellitus without complication ([place])    Hypertension     No past surgical history on file.  MEDICATIONS:  Prior to Admission medications   Medication Sig Start Date End Date Taking? Authorizing Provider  allopurinol (ZYLOPRIM) 300 MG tablet Take 450 mg by mouth daily. 04/27/21   [provider]  aspirin 81 MG chewable tablet Chew 81 mg by mouth daily. 05/19/18   [provider]  atorvastatin (LIPITOR) 40 MG tablet Take 40 mg by mouth daily.    [provider]  DULoxetine (CYMBALTA) 60 MG capsule Take 60 mg by mouth 2 (two) times daily. 04/15/21   [provider]  Ferrous Gluconate (IRON 27 PO) Take by mouth.    [provider]  hydrochlorothiazide (MICROZIDE) 12.5 MG capsule Take 12.5 mg by mouth daily.    [provider]  isosorbide mononitrate (IMDUR) 30 MG 24 hr tablet Take 30 mg by mouth daily. 03/18/21   [provider]  losartan (COZAAR) 100 MG tablet Take  100 mg by mouth daily.    [provider]  Magnesium 250 MG TABS Take 250 mg by mouth daily.    [provider]  melatonin 5 MG TABS Take 5 mg by mouth at bedtime.    [provider]  metFORMIN (GLUCOPHAGE) 500 MG tablet Take 1 tablet by mouth 2 (two) times daily. 04/27/21   [provider]  methocarbamol (ROBAXIN) 500 MG tablet Take 500 mg by mouth 3 (three) times daily. 01/06/22   [provider]  OZEMPIC, 0.25 OR 0.5 MG/DOSE, 2 MG/1.5ML SOPN Inject 0.5 mg into the skin once a week. 11/08/21   [provider]  RABEprazole (ACIPHEX) 20 MG tablet Take 20 mg by mouth daily. 04/19/21   [provider]    Physical Exam   Triage Vital Signs: ED Triage Vitals  Enc Vitals Group     BP 09/18/22 1925 104/67     Pulse Rate 09/18/22 1925 64     Resp 09/18/22 1925 19     Temp 09/18/22 1925 98.3 F (36.8 C)     Temp Source 09/18/22 1925 Oral     SpO2 09/19/22 0004 98 %     Weight 09/18/22 1927 200 lb (90.7 kg)     Height 09/18/22 1927 5\' 9"  (1.753 m)     Head Circumference --      Peak Flow --      Pain Score 09/18/22 1926 3  Pain Loc --      Pain Edu? --      Excl. in [place]? --     Most recent vital signs: Vitals:   09/19/22 0004 09/19/22 0400  BP: 131/79 (!) 172/79  Pulse: 63 66  Resp: 18 17  Temp:    SpO2: 98% 100%    CONSTITUTIONAL: Alert and oriented and responds appropriately to questions. Well-appearing; well-nourished HEAD: Normocephalic, atraumatic EYES: Conjunctivae clear, pupils appear equal, sclera nonicteric ENT: normal nose; moist mucous membranes NECK: Supple, normal ROM CARD: RRR; S1 and S2 appreciated; no murmurs, no clicks, no rubs, no gallops RESP: Normal chest excursion without splinting or tachypnea; breath sounds clear and equal bilaterally; no wheezes, no rhonchi, no rales, no hypoxia or respiratory distress, speaking full sentences ABD/GI: Normal bowel sounds; non-distended; soft, non-tender, no  rebound, no guarding, no peritoneal signs BACK: The back appears normal EXT: Normal ROM in all joints; no deformity noted, no edema; no cyanosis SKIN: Normal color for age and race; warm; no rash on exposed skin NEURO: Moves all extremities equally, normal speech PSYCH: The patient's mood and manner are appropriate.   ED Results / Procedures / Treatments   LABS: (all labs ordered are listed, but only abnormal results are displayed) Labs Reviewed  CBC WITH DIFFERENTIAL/PLATELET - Abnormal; Notable for the following components:      Result Value   RBC 3.72 (*)    Hemoglobin 11.7 (*)    HCT 35.1 (*)    All other components within normal limits  URINALYSIS, ROUTINE W REFLEX MICROSCOPIC - Abnormal; Notable for the following components:   Color, Urine YELLOW (*)    APPearance HAZY (*)    All other components within normal limits  COMPREHENSIVE METABOLIC PANEL  PROCALCITONIN  LACTIC ACID, PLASMA  TROPONIN I (HIGH SENSITIVITY)  TROPONIN I (HIGH SENSITIVITY)     EKG:  EKG Interpretation  Date/Time:  Thursday September 18 2022 20:19:40 EDT Ventricular Rate:  61 PR Interval:  174 QRS Duration: 90 QT Interval:  420 QTC Calculation: 422 R Axis:   -10 Text Interpretation: Normal sinus rhythm Normal ECG When compared with ECG of 04-Feb-2021 12:54, Vent. rate has decreased BY  47 BPM Confirmed by Pryor Curia [phone]) on 09/19/2022 1:05:55 AM         RADIOLOGY: My personal review and interpretation of imaging:    I have personally reviewed all radiology reports.   No results found.   PROCEDURES:  Critical Care performed: No     Procedures    IMPRESSION / MDM / ASSESSMENT AND PLAN / ED COURSE  I reviewed the triage vital signs and the nursing notes.    Patient here for hypotension.  Has history of hypertension on 3 antihypertensive medications at home.  No infectious symptoms.  No chest pain.  No vomiting, diarrhea.  The patient is on the cardiac monitor to  evaluate for evidence of arrhythmia and/or significant heart rate changes.   DIFFERENTIAL DIAGNOSIS (includes but not limited to):   Dehydration, orthostasis, less likely ACS, PE, dissection, sepsis, cardiogenic shock, hemorrhage   Patient's presentation is most consistent with acute presentation with potential threat to life or bodily function.   PLAN: We will continue IV fluids here.  Will obtain CBC, CMP, lactic, procalcitonin, urinalysis, troponin.  We will check orthostatic vital signs after IV fluids complete.   MEDICATIONS GIVEN IN ED: Medications  sodium chloride 0.9 % bolus 1,000 mL (0 mLs Intravenous Stopped 09/19/22 0414)     ED  COURSE: Patient has no leukocytosis.  Stable mild anemia.  Normal electrolytes and creatinine.  Lactic normal.  Procalcitonin negative.  Troponin x2 negative.  Urine shows no sign of dehydration or ketonuria.  EKG nonischemic.  No further hypotension noted here in the emergency department and orthostatics are now negative.  He states he is feeling better and is comfortable with the plan for discharge home.  Have recommended that he hold his blood pressure medications for the next 2 to 3 days but that he should have a conversation with his PCP this morning for close outpatient follow-up and further directions.  Recommended increase fluid intake at home.  Of note he is mildly hypertensive here on discharge.  At this time, I do not feel there is any life-threatening condition present. I reviewed all nursing notes, vitals, pertinent previous records.  All lab and urine results, EKGs, imaging ordered have been independently reviewed and interpreted by myself.  I reviewed all available radiology reports from any imaging ordered this visit.  Based on my assessment, I feel the patient is safe to be discharged home without further emergent workup and can continue workup as an outpatient as needed. Discussed all findings, treatment plan as well as usual and customary  return precautions.  They verbalize understanding and are comfortable with this plan.  Outpatient follow-up has been provided as needed.  All questions have been answered.     CONSULTS:  Admission considered but work-up reassuring and patient is feeling better and hypotension has resolved.   OUTSIDE RECORDS REVIEWED: Reviewed patient's recent PCP note on 09/16/2022.       FINAL CLINICAL IMPRESSION(S) / ED DIAGNOSES   Final diagnoses:  Hypotension, unspecified hypotension type     Rx / DC Orders   ED Discharge Orders     None        Note:  This document was prepared using Dragon voice recognition software and may include unintentional dictation errors.   Minyon Billiter, Delice Bison, DO 09/19/22 1054

## 2022-09-19 NOTE — Discharge Instructions (Addendum)
I would recommend holding your blood pressure medication for the next 2 to 3 days.  I recommend calling your primary care doctor first thing in the morning for close outpatient follow-up.  Your lab work, urine today was very reassuring with no signs of anemia, heart attack, significant dehydration, infection.  I recommend increasing your fluid intake at home.  When you stand up to get up from a seated or laying down position I recommend that you do so very slowly.  Please return to the emergency department if you have chest pain, shortness of breath, dizziness, feel like you are going to pass out or do pass out, blood pressures under 100/60.

## 2022-10-20 ENCOUNTER — Encounter: Payer: Self-pay | Admitting: Cardiology

## 2022-10-20 ENCOUNTER — Ambulatory Visit: Payer: Medicare Other | Attending: Cardiology | Admitting: Cardiology

## 2022-10-20 VITALS — BP 146/90 | HR 63 | Ht 69.0 in | Wt 206.4 lb

## 2022-10-20 DIAGNOSIS — R072 Precordial pain: Secondary | ICD-10-CM | POA: Insufficient documentation

## 2022-10-20 DIAGNOSIS — E7800 Pure hypercholesterolemia, unspecified: Secondary | ICD-10-CM | POA: Insufficient documentation

## 2022-10-20 NOTE — Progress Notes (Signed)
Cardiology Office Note:    Date:  10/20/2022   ID:  Ricky, Figueroa [birth year], MRN [id number]  PCP:  Ricky Lighter, MD   [facility] Providers Cardiologist:  None     Referring MD: Ricky Lighter, MD   Chief Complaint  Patient presents with   Follow-up    Follow up, Last seen Feb 2023, high and low Pressure readings, Chest pain      History of Present Illness:    Ricky Figueroa is a 66 y.o. male with a hx of hypertension, diabetes, hyperlipidemia who presents for follow-up.    He has neck and back issues/pain, sees neurosurgery specialist for this.  Blood pressure usually goes low when he raises his arm causing severe pain.  Recently seen in the hospital with significantly low blood pressures.  Was previously on losartan and HCTZ.  BP meds were stopped.  Subsequently had elevated blood pressures leading to resumption of losartan.  Follow-up with PCP, metoprolol 25 mg twice daily was added.  His blood pressures have stayed normal with systolics in the [id number] on average.  Prior notes Echo 05/2021 EF 60 to 65%, mild aortic regurgitation, mild aortic root dilatation Lexiscan Myoview 05/2021 no evidence for ischemia.   Past Medical History:  Diagnosis Date   Diabetes mellitus without complication ([place])    Hypertension     History reviewed. No pertinent surgical history.  Current Medications: Current Meds  Medication Sig   allopurinol (ZYLOPRIM) 300 MG tablet Take 450 mg by mouth daily.   aspirin 81 MG chewable tablet Chew 81 mg by mouth daily.   atorvastatin (LIPITOR) 40 MG tablet Take 40 mg by mouth daily.   cyanocobalamin (VITAMIN B12) 1000 MCG tablet Take 1,000 mcg by mouth daily.   cyclobenzaprine (FLEXERIL) 10 MG tablet Take 10 mg by mouth 3 (three) times daily as needed.   DULoxetine (CYMBALTA) 60 MG capsule Take 60 mg by mouth 2 (two) times daily.   Ferrous Gluconate (IRON 27 PO) Take by mouth.   gabapentin (NEURONTIN) 100 MG capsule  Take 1 capsule by mouth 2 (two) times daily.   isosorbide mononitrate (IMDUR) 30 MG 24 hr tablet Take 30 mg by mouth daily.   losartan (COZAAR) 100 MG tablet Take 100 mg by mouth daily.   Magnesium 250 MG TABS Take 250 mg by mouth daily.   metFORMIN (GLUCOPHAGE) 500 MG tablet Take 1 tablet by mouth 2 (two) times daily.   methocarbamol (ROBAXIN) 500 MG tablet Take 500 mg by mouth 3 (three) times daily.   metoprolol tartrate (LOPRESSOR) 25 MG tablet Take 25 mg by mouth 2 (two) times daily.   OZEMPIC, 0.25 OR 0.5 MG/DOSE, 2 MG/1.5ML SOPN Inject 0.5 mg into the skin once a week.   RABEprazole (ACIPHEX) 20 MG tablet Take 20 mg by mouth daily.   zonisamide (ZONEGRAN) 100 MG capsule Take 200 mg by mouth daily.     Allergies:   Iodine and Oxycodone-acetaminophen   Social History   Socioeconomic History   Marital status: Married    Spouse name: Not on file   Number of children: Not on file   Years of education: Not on file   Highest education level: Not on file  Occupational History   Not on file  Tobacco Use   Smoking status: Never   Smokeless tobacco: Never  Substance and Sexual Activity   Alcohol use: Yes    Comment: occasional   Drug use: Never   Sexual activity: Not  on file  Other Topics Concern   Not on file  Social History Narrative   Not on file   Social Determinants of Health   Financial Resource Strain: Not on file  Food Insecurity: Not on file  Transportation Needs: Not on file  Physical Activity: Not on file  Stress: Not on file  Social Connections: Not on file     Family History: The patient's family history is not on file.  ROS:   Please see the history of present illness.     All other systems reviewed and are negative.  EKGs/Labs/Other Studies Reviewed:    The following studies were reviewed today:   EKG:  EKG is  ordered today.  The ekg ordered today demonstrates normal sinus rhythm, normal ECG  Recent Labs: 09/18/2022: ALT 29; BUN 21; Creatinine,  Ser 1.09; Hemoglobin 11.7; Platelets 287; Potassium 3.9; Sodium 139  Recent Lipid Panel No results found for: "CHOL", "TRIG", "HDL", "CHOLHDL", "VLDL", "LDLCALC", "LDLDIRECT"   Risk Assessment/Calculations:      Physical Exam:    VS:  BP (!) 146/90 (BP Location: Left Arm, Patient Position: Sitting, Cuff Size: Normal)   Pulse 63   Ht 5\' 9"  (1.753 m)   Wt 206 lb 6.4 oz (93.6 kg)   SpO2 96%   BMI 30.48 kg/m     Wt Readings from Last 3 Encounters:  10/20/22 206 lb 6.4 oz (93.6 kg)  09/18/22 200 lb (90.7 kg)  01/23/22 230 lb (104.3 kg)     GEN:  Well nourished, well developed in no acute distress HEENT: Normal NECK: No JVD; No carotid bruits CARDIAC: RRR, no murmurs, rubs, gallops RESPIRATORY:  Clear to auscultation without rales, wheezing or rhonchi  ABDOMEN: Soft, non-tender, non-distended MUSCULOSKELETAL:  No edema; tenderness with movement of right arm. SKIN: Warm and dry NEUROLOGIC:  Alert and oriented x 3 PSYCHIATRIC:  Normal affect   ASSESSMENT:    1. Precordial pain   2. Pure hypercholesterolemia    PLAN:    In order of problems listed above:  Hypertension, BP elevated today, controlled at home.  Has a component of whitecoat syndrome.  Continue losartan 100 mg daily, metoprolol 25 mg twice daily Hyperlipidemia, continue Lipitor 40 mg daily.  Follow-up in 12 months.   Medication Adjustments/Labs and Tests Ordered: Current medicines are reviewed at length with the patient today.  Concerns regarding medicines are outlined above.  Orders Placed This Encounter  Procedures   EKG 12-Lead    No orders of the defined types were placed in this encounter.    Patient Instructions  Medication Instructions:   Your physician recommends that you continue on your current medications as directed. Please refer to the Current Medication list given to you today.   *If you need a refill on your cardiac medications before your next appointment, please call your  pharmacy*    Follow-Up: At [facility], you and your health needs are our priority.  As part of our continuing mission to provide you with exceptional heart care, we have created designated Provider Care Teams.  These Care Teams include your primary Cardiologist (physician) and Advanced Practice Providers (APPs -  Physician Assistants and Nurse Practitioners) who all work together to provide you with the care you need, when you need it.  We recommend signing up for the patient portal called "MyChart".  Sign up information is provided on this After Visit Summary.  MyChart is used to connect with patients for Virtual Visits (Telemedicine).  Patients are  able to view lab/test results, encounter notes, upcoming appointments, etc.  Non-urgent messages can be sent to your provider as well.   To learn more about what you can do with MyChart, go to [email or web address].    Your next appointment:   1 year(s)  The format for your next appointment:   In Person  Provider:   Kate Sable, MD    Other Instructions   Important Information About Sugar         Signed, Kate Sable, MD  10/20/2022 11:51 AM    [place]

## 2022-10-20 NOTE — Patient Instructions (Signed)
Medication Instructions:   Your physician recommends that you continue on your current medications as directed. Please refer to the Current Medication list given to you today.  *If you need a refill on your cardiac medications before your next appointment, please call your pharmacy*   Follow-Up: At [facility], you and your health needs are our priority.  As part of our continuing mission to provide you with exceptional heart care, we have created designated Provider Care Teams.  These Care Teams include your primary Cardiologist (physician) and Advanced Practice Providers (APPs -  Physician Assistants and Nurse Practitioners) who all work together to provide you with the care you need, when you need it.  We recommend signing up for the patient portal called "MyChart".  Sign up information is provided on this After Visit Summary.  MyChart is used to connect with patients for Virtual Visits (Telemedicine).  Patients are able to view lab/test results, encounter notes, upcoming appointments, etc.  Non-urgent messages can be sent to your provider as well.   To learn more about what you can do with MyChart, go to [email or web address].    Your next appointment:   1 year(s)  The format for your next appointment:   In Person  Provider:   Brian Agbor-Etang, MD    Other Instructions   Important Information About Sugar       

## 2023-09-21 ENCOUNTER — Other Ambulatory Visit: Payer: Self-pay | Admitting: Internal Medicine

## 2023-09-21 DIAGNOSIS — R29810 Facial weakness: Secondary | ICD-10-CM

## 2023-09-21 DIAGNOSIS — H02401 Unspecified ptosis of right eyelid: Secondary | ICD-10-CM

## 2023-09-22 ENCOUNTER — Ambulatory Visit
Admission: RE | Admit: 2023-09-22 | Discharge: 2023-09-22 | Disposition: A | Discharge status: Home / Self Care | Payer: Medicare Other | Source: Ambulatory Visit | Attending: Internal Medicine | Admitting: Internal Medicine

## 2023-09-22 DIAGNOSIS — H02401 Unspecified ptosis of right eyelid: Secondary | ICD-10-CM | POA: Diagnosis present

## 2023-09-22 DIAGNOSIS — R29810 Facial weakness: Secondary | ICD-10-CM | POA: Insufficient documentation

## 2023-09-29 ENCOUNTER — Encounter: Payer: Self-pay | Admitting: Cardiology

## 2023-11-20 ENCOUNTER — Ambulatory Visit: Payer: Medicare Other | Attending: Cardiology | Admitting: Cardiology

## 2023-11-20 ENCOUNTER — Encounter: Payer: Self-pay | Admitting: Cardiology

## 2023-11-20 VITALS — BP 132/82 | HR 68 | Ht 69.0 in | Wt 216.4 lb

## 2023-11-20 DIAGNOSIS — I10 Essential (primary) hypertension: Secondary | ICD-10-CM | POA: Insufficient documentation

## 2023-11-20 DIAGNOSIS — E7800 Pure hypercholesterolemia, unspecified: Secondary | ICD-10-CM | POA: Diagnosis present

## 2023-11-20 NOTE — Patient Instructions (Signed)

## 2023-11-20 NOTE — Progress Notes (Signed)
Cardiology Office Note:    Date:  11/20/2023   ID:  Ricky Figueroa, DOB [birth year], MRN [id number]  PCP:  Enid Baas, MD   [facility] Providers Cardiologist:  Debbe Odea, MD     Referring MD: Enid Baas, MD   Chief Complaint  Patient presents with   Follow-up    Patient concerned elevated bp and is being treated by PCP with med changes    History of Present Illness:    Ricky Figueroa is a 67 y.o. male with a hx of hypertension, diabetes, hyperlipidemia who presents for follow-up.    Denies chest pain or shortness of breath, blood pressures have been elevated of late, started on amlodipine 2.5 mg twice daily by PCP.  Blood pressures have improved with systolics at home in the 120s to 130s over the past week.  Also compliant with losartan 100 mg daily.  Was previously on HCTZ, states being dehydrated and having low BPs.  HCTZ was stopped.  Prior notes Echo 05/2021 EF 60 to 65%, mild aortic regurgitation, mild aortic root dilatation Lexiscan Myoview 05/2021 no evidence for ischemia.   Past Medical History:  Diagnosis Date   Diabetes mellitus without complication ([facility])    Hypertension     History reviewed. No pertinent surgical history.  Current Medications: Current Meds  Medication Sig   allopurinol (ZYLOPRIM) 300 MG tablet Take 450 mg by mouth daily.   amLODipine (NORVASC) 2.5 MG tablet Take 2.5 mg by mouth in the morning and at bedtime.   aspirin 81 MG chewable tablet Chew 81 mg by mouth daily.   atorvastatin (LIPITOR) 40 MG tablet Take 40 mg by mouth daily.   cyanocobalamin (VITAMIN B12) 1000 MCG tablet Take 1,000 mcg by mouth daily.   cyclobenzaprine (FLEXERIL) 10 MG tablet Take 10 mg by mouth 3 (three) times daily as needed.   Ferrous Gluconate (IRON 27 PO) Take by mouth.   gabapentin (NEURONTIN) 600 MG tablet Take 600 mg by mouth 3 (three) times daily.   losartan (COZAAR) 100 MG tablet Take 100 mg by mouth daily.   Magnesium 250 MG  TABS Take 250 mg by mouth daily.   metFORMIN (GLUCOPHAGE) 500 MG tablet Take 1 tablet by mouth 2 (two) times daily.   methocarbamol (ROBAXIN) 500 MG tablet Take 500 mg by mouth 3 (three) times daily.   naratriptan (AMERGE) 2.5 MG tablet Take 2.5 mg by mouth as needed for migraine. Take one (1) tablet at onset of headache; if returns or does not resolve, may repeat after 4 hours; do not exceed five (5) mg in 24 hours.   OZEMPIC, 0.25 OR 0.5 MG/DOSE, 2 MG/1.5ML SOPN Inject 0.5 mg into the skin once a week.   RABEprazole (ACIPHEX) 20 MG tablet Take 20 mg by mouth daily.   tamsulosin (FLOMAX) 0.4 MG CAPS capsule Take 0.4 mg by mouth.   zonisamide (ZONEGRAN) 100 MG capsule Take 200 mg by mouth daily.     Allergies:   Iodine and Oxycodone-acetaminophen   Social History   Socioeconomic History   Marital status: Married    Spouse name: Not on file   Number of children: Not on file   Years of education: Not on file   Highest education level: Not on file  Occupational History   Not on file  Tobacco Use   Smoking status: Never   Smokeless tobacco: Never  Substance and Sexual Activity   Alcohol use: Yes    Comment: occasional   Drug use: Never  Sexual activity: Not on file  Other Topics Concern   Not on file  Social History Narrative   Not on file   Social Drivers of Health   Financial Resource Strain: Low Risk  (08/18/2023)   Received [facility]   Overall Financial Resource Strain (CARDIA)    Difficulty of Paying Living Expenses: Not hard at all  Food Insecurity: No Food Insecurity (08/18/2023)   Received from [facility]   Hunger Vital Sign    Worried About Running Out of Food in the Last Year: Never true    Ran Out of Food in the Last Year: Never true  Transportation Needs: No Transportation Needs (08/18/2023)   Received [facility] - Transportation    In the past 12 months, has lack of transportation  kept you from medical appointments or from getting medications?: No    Lack of Transportation (Non-Medical): No  Physical Activity: Inactive (10/03/2021)   Received [facility], [facility]   Exercise Vital Sign    Days of Exercise per Week: 0 days    Minutes of Exercise per Session: 0 min  Stress: No Stress Concern Present (10/03/2021)   Received from [facility], [facility]   [employer] of Occupational Health - Occupational Stress Questionnaire    Feeling of Stress : Only a little  Social Connections: Not on file     Family History: The patient's family history is not on file.  ROS:   Please see the history of present illness.     All other systems reviewed and are negative.  EKGs/Labs/Other Studies Reviewed:    The following studies were reviewed today:   EKG Interpretation Date/Time:  Friday November 20 2023 08:32:53 EST Ventricular Rate:  68 PR Interval:  184 QRS Duration:  102 QT Interval:  412 QTC Calculation: 438 R Axis:   -9  Text Interpretation: Normal sinus rhythm Normal ECG Confirmed by Debbe Odea ([id number]) on 11/20/2023 8:35:14 AM    Recent Labs: No results found for requested labs within last 365 days.  Recent Lipid Panel No results found for: "CHOL", "TRIG", "HDL", "CHOLHDL", "VLDL", "LDLCALC", "LDLDIRECT"  Outside lipid panel 12/29/2022: Total cholesterol 127, LDL 56, HDL 49, triglycerides 109.  Risk Assessment/Calculations:      Physical Exam:    VS:  BP 132/82 (BP Location: Left Arm, Patient Position: Sitting, Cuff Size: Large)   Pulse 68   Ht 5\' 9"  (1.753 m)   Wt 216 lb 6.4 oz (98.2 kg)   SpO2 98%   BMI 31.96 kg/m     Wt Readings from Last 3 Encounters:  11/20/23 216 lb 6.4 oz (98.2 kg)  10/20/22 206 lb 6.4 oz (93.6 kg)  09/18/22 200 lb (90.7 kg)     GEN:  Well nourished, well developed in no acute distress HEENT: Normal NECK: No JVD; No  carotid bruits CARDIAC: RRR, no murmurs, rubs, gallops RESPIRATORY:  Clear to auscultation without rales, wheezing or rhonchi  ABDOMEN: Soft, non-tender, non-distended MUSCULOSKELETAL:  No edema; tenderness with movement of right arm. SKIN: Warm and dry NEUROLOGIC:  Alert and oriented x 3 PSYCHIATRIC:  Normal affect   ASSESSMENT:    1. Primary hypertension   2. Pure hypercholesterolemia    PLAN:    In order of problems listed above:  Hypertension, BP controlled. Continue losartan 100 mg daily, Norvasc 2.5 mg twice daily. Hyperlipidemia, cholesterol controlled.  Continue Lipitor 40 mg daily.  Follow-up yearly.   Medication Adjustments/Labs and Tests Ordered: Current medicines are reviewed at length with the patient today.  Concerns regarding medicines are outlined above.  Orders Placed This Encounter  Procedures   EKG 12-Lead    No orders of the defined types were placed in this encounter.    Patient Instructions  .Medication Instructions:   Your physician recommends that you continue on your current medications as directed. Please refer to the Current Medication list given to you today.  *If you need a refill on your cardiac medications before your next appointment, please call your pharmacy*   Lab Work:  None Ordered  If you have labs (blood work) drawn today and your tests are completely normal, you will receive your results only by: MyChart Message (if you have MyChart) OR A paper copy in the mail If you have any lab test that is abnormal or we need to change your treatment, we will call you to review the results.   Testing/Procedures:  None Ordered   Follow-Up: At [facility], you and your health needs are our priority.  As part of our continuing mission to provide you with exceptional heart care, we have created designated Provider Care Teams.  These Care Teams include your primary Cardiologist (physician) and Advanced Practice Providers (APPs  -  Physician Assistants and Nurse Practitioners) who all work together to provide you with the care you need, when you need it.  We recommend signing up for the patient portal called "MyChart".  Sign up information is provided on this After Visit Summary.  MyChart is used to connect with patients for Virtual Visits (Telemedicine).  Patients are able to view lab/test results, encounter notes, upcoming appointments, etc.  Non-urgent messages can be sent to your provider as well.   To learn more about what you can do with MyChart, go to [email or web address].    Your next appointment:   12 month(s)  Provider:   You may see Debbe Odea, MD or one of the following Advanced Practice Providers on your designated Care Team:   Nicolasa Ducking, NP Eula Listen, PA-C Cadence Fransico Michael, PA-C Charlsie Quest, NP Carlos Levering, NP   Signed, Debbe Odea, MD  11/20/2023 9:28 AM    [facility]

## 2024-05-23 ENCOUNTER — Encounter: Payer: Self-pay | Admitting: Cardiology

## 2024-09-26 ENCOUNTER — Ambulatory Visit: Attending: Rheumatology

## 2024-09-26 DIAGNOSIS — M19042 Primary osteoarthritis, left hand: Secondary | ICD-10-CM | POA: Insufficient documentation

## 2024-09-26 DIAGNOSIS — M6281 Muscle weakness (generalized): Secondary | ICD-10-CM | POA: Insufficient documentation

## 2024-09-26 DIAGNOSIS — M19041 Primary osteoarthritis, right hand: Secondary | ICD-10-CM | POA: Insufficient documentation

## 2024-09-26 DIAGNOSIS — R278 Other lack of coordination: Secondary | ICD-10-CM | POA: Diagnosis present

## 2024-09-26 NOTE — Therapy (Unsigned)
 OUTPATIENT OCCUPATIONAL THERAPY ORTHO EVALUATION  Patient Name: Ricky Figueroa MRN: [id number] DOB:May 29, 1956, 68 y.o., male Today's Date: 09/28/2024  PCP: [facility] REFERRING PROVIDER: Dr. Tobie, Mayur  END OF SESSION:  OT End of Session - 09/28/24 0905     Visit Number 1    Number of Visits 24    Date for Recertification  12/19/24    Progress Note Due on Visit 10    OT Start Time 1015    OT Stop Time 1100    OT Time Calculation (min) 45 min    Activity Tolerance Patient tolerated treatment well    Behavior During Therapy WFL for tasks assessed/performed          Past Medical History:  Diagnosis Date   Diabetes mellitus without complication (HCC)    Hypertension    No past surgical history on file. There are no active problems to display for this patient.  ONSET DATE: Worsening symptoms in the last year  REFERRING DIAG: Primary OA, right hand; Primary OA, left hand  THERAPY DIAG:  Muscle weakness (generalized)  Other lack of coordination  Primary osteoarthritis, right hand  Primary osteoarthritis, left hand  Rationale for Evaluation and Treatment: Rehabilitation  SUBJECTIVE:   SUBJECTIVE STATEMENT: Pt reports worsening pain in BUEs and weakness, specifically in the R dominant hand. Pt accompanied by: self  PERTINENT HISTORY:  09/26/24: Pt reports having 4 neck surgeries between 2017 and 2021, with paresthesias in both arms/hands since these surgeries.  Hx of L reverse TSA in Jan, remote hx of trigger finger release R LF PIP.  Per Rheumatology note on 09/16/24: Ricky Figueroa is a 68 y.o. male is here today for follow up of gout and osteoarthritis. The patient's allergies, current medications, past family history, past medical history, past social history, past surgical history and problem list were reviewed and updated as appropriate.    He is taking the allopurinol. He is tolerating this well. He has no gout flare since last evaluation. He  states that over all he is feeling well.  He is monitoring diet, avoid gout foods. He is followed by spine clinic regarding his degenerative disc disease. He is on naproxen for pain control.    He is getting pain of the hands.He has been to OT many years ago. He does get pain down the arms. He does get paresthesia of the hands.    He wife passed away in 05/02/2024. He has been under stress due to that.   Assessment and Plan    Diagnoses and all orders for this visit:   Idiopathic chronic gout of multiple sites without tophus   Primary osteoarthritis of both hands -     Ambulatory Referral to Occupational Therapy   Multilevel degenerative disc disease -     nortriptyline (PAMELOR) 10 MG capsule; Take 1 capsule (10 mg total) by mouth at bedtime  PRECAUTIONS: None  RED FLAGS: None   WEIGHT BEARING RESTRICTIONS: No  PAIN:  Are you having pain? Yes: NPRS scale: 1-2, up to 7-8/10 at night time or with activity Pain location: R/L shoulders down to the hands Pain description: tingling/shooting Aggravating factors: prolonged positioning, night time Relieving factors: getting up and moving   FALLS: Has patient fallen in last 6 months? No  LIVING ENVIRONMENT: Lives with: lives alone with dog Lives in: 3 level home  Stairs: 5 steps from the garage, rail on the R Has following equipment at home: Single point cane, shower chair, and Grab bars  PLOF: Independent, retired Art gallery manager   PATIENT GOALS: Improve my pain.  NEXT MD VISIT: Pt will return to Dr. Tobie for rheumatology follow up in 3-4 months, and PCP in Feb   OBJECTIVE:  Note: Objective measures were completed at Evaluation unless otherwise noted.  HAND DOMINANCE: Right  ADLs: Eating: utensils occasionally slip from grip, can be challenging to cut food; pt reports stabilizing a fork can be more challenging than a knife. Grooming: indep Upper body dressing: pt reports no difficulties with clothing fasteners Lower body  dressing: indep Toileting: indep Bathing: indep IADLs: increased pain/paresthesias in BUEs with tool use/repetitive tasks/activities which require grasping/pinching/carrying/lifting  UPPER EXTREMITY ROM:  BUEs WFL for ADLs; able to make full composite fists in each hand and oppose each digit to thumb in R/L hands  UPPER EXTREMITY MMT:  Bilat shoulders 4+-5/5, 5/5 throughout bilat elbows/wrists  HAND FUNCTION: Grip strength: Right: 68 lbs; Left: 79 lbs, Lateral pinch: Right: 19 lbs, Left: 24 lbs, and 3 point pinch: Right: 13 lbs, Left: 20 lbs  COORDINATION: 9 Hole Peg test: Right: 34 sec; Left: 35 sec  SENSATION: Tingling in BUEs, more severe in the RUE   EDEMA: No visible edema   COGNITION: Overall cognitive status: Within functional limits for tasks assessed  OBSERVATIONS:  Pt pleasant, cooperative, and eager to improve symptoms of pain in BUEs and weakness in the R hand.  TREATMENT DATE: 09/26/24                                                                                                                           Evaluation completed.  Self Care: Education provided on poc, goals, anticipated treatments to manage symptoms.  PATIENT EDUCATION: Education details: OT role, goals, poc Person educated: Patient Education method: Explanation and Verbal cues Education comprehension: verbalized understanding  HOME EXERCISE PROGRAM: To be initiated in follow up sessions  GOALS: Goals reviewed with patient? Yes  SHORT TERM GOALS: Target date: 11/07/24  Pt will be indep to perform HEP for improving R hand strength and maintaining flexibility throughout BUEs. Baseline: Eval: Not yet initiated Goal status: INITIAL  LONG TERM GOALS: Target date: 12/19/24  Pt will identify and implement 1-2 compensatory strategies and/or pieces of AE in order to ease performance of daily tasks.    Baseline: Eval: Education not yet initiated Goal status: INITIAL  2.  Pt will be indep to  verbalize 2-3 joint protection/cumulative trauma prevention strategies to reduce pain/paresthesias in BUEs.  Baseline: Eval: Education not yet initiated Goal status: INITIAL  3.  Pt will tolerate manual therapy, therapeutic modalities, and exercises to decrease pain in BUEs to a reported 4/10 pain or less with activity.   Baseline: Eval: 7-8/10 pain in BUEs Goal status: INITIAL  4.  Pt will increase R grip strength by 10 or more lbs for improving ability to hold and operate power tools with R dominant hand. Baseline: Eval: R 68 lbs (L non-dominant 79 lbs) Goal status: INITIAL  ASSESSMENT:  CLINICAL IMPRESSION: Patient is a 68 y.o. male who was seen today for occupational therapy evaluation for functional decline related to OA in bilat hands and BUE paresthesias related to cervical spinal surgeries.  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  Pt endorses increased pain throughout BUEs and weakness in the R dominant hand, impacting IADL participation and sleep.  Pt will benefit from education related to joint protection/cumulative trauma prevention, HEP instruction, instruction in AE/activity modification in order to better manage symptoms of chronic conditions noted above which are impacting sleep, ADL, and IADL performance.  Pt in agreement with plan.    PERFORMANCE DEFICITS: in functional skills including ADLs, IADLs, coordination, dexterity, sensation, ROM, strength, pain, flexibility, Fine motor control, body mechanics, decreased knowledge of precautions, decreased knowledge of use of DME, and UE functional use, and psychosocial skills including coping strategies, environmental adaptation, habits, and routines and behaviors.   IMPAIRMENTS: are limiting patient from ADLs, IADLs, rest and sleep, and leisure.   COMORBIDITIES: has co-morbidities such as HTN, DM, hx of cervical spinal surgeries that affects occupational  performance. Patient will benefit from skilled OT to address above impairments and improve overall function.  MODIFICATION OR ASSISTANCE TO COMPLETE EVALUATION: No modification of tasks or assist necessary to complete an evaluation.  OT OCCUPATIONAL PROFILE AND HISTORY: Detailed assessment: Review of records and additional review of physical, cognitive, psychosocial history related to current functional performance.  CLINICAL DECISION MAKING: Moderate - several treatment options, min-mod task modification necessary  REHAB POTENTIAL: Good  EVALUATION COMPLEXITY: Moderate      PLAN:  OT FREQUENCY: 1-2x/week  OT DURATION: up to 12 weeks  PLANNED INTERVENTIONS: 97168 OT Re-evaluation, 97535 self care/ADL training, 02889 therapeutic exercise, 97530 therapeutic activity, 97112 neuromuscular re-education, 97140 manual therapy, 97010 moist heat, 97010 cryotherapy, 97034 contrast bath, 97750 Physical Performance Testing, 02239 Orthotic Initial, 97763 Orthotic/Prosthetic subsequent, passive range of motion, psychosocial skills training, energy conservation, coping strategies training, patient/family education, and DME and/or AE instructions  RECOMMENDED OTHER SERVICES: None at this time  CONSULTED AND AGREED WITH PLAN OF CARE: Patient  PLAN FOR NEXT SESSION: Instruct in UE nerve glides, issue theraputty, joint protection education   Inocente Blazing, MS, OTR/L  Inocente MARLA Blazing, OT 09/28/2024, 9:07 AM

## 2024-09-28 ENCOUNTER — Ambulatory Visit

## 2024-09-28 DIAGNOSIS — M19041 Primary osteoarthritis, right hand: Secondary | ICD-10-CM

## 2024-09-28 DIAGNOSIS — M6281 Muscle weakness (generalized): Secondary | ICD-10-CM | POA: Diagnosis not present

## 2024-09-28 DIAGNOSIS — R278 Other lack of coordination: Secondary | ICD-10-CM

## 2024-09-28 DIAGNOSIS — M19042 Primary osteoarthritis, left hand: Secondary | ICD-10-CM

## 2024-09-28 NOTE — Therapy (Signed)
 OUTPATIENT OCCUPATIONAL THERAPY ORTHO TREATMENT NOTE  Patient Name: Ricky Figueroa MRN: [id number] DOB:02-09-1956, 68 y.o., male Today's Date: 10/02/2024  PCP: [facility] REFERRING PROVIDER: Dr. Tobie, Mayur  END OF SESSION:  OT End of Session - 10/02/24 2049     Visit Number 2    Number of Visits 24    Date for Recertification  12/19/24    Progress Note Due on Visit 10    OT Start Time 1400    OT Stop Time 1445    OT Time Calculation (min) 45 min    Activity Tolerance Patient tolerated treatment well    Behavior During Therapy WFL for tasks assessed/performed         Past Medical History:  Diagnosis Date   Diabetes mellitus without complication (HCC)    Hypertension    No past surgical history on file. There are no active problems to display for this patient.  ONSET DATE: Worsening symptoms in the last year  REFERRING DIAG: Primary OA, right hand; Primary OA, left hand  THERAPY DIAG:  Muscle weakness (generalized)  Other lack of coordination  Primary osteoarthritis, right hand  Primary osteoarthritis, left hand  Rationale for Evaluation and Treatment: Rehabilitation  SUBJECTIVE:   SUBJECTIVE STATEMENT: Pt reports improvement in pain in hands by end of session. Pt accompanied by: self  PERTINENT HISTORY:  09/26/24: Pt reports having 4 neck surgeries between 2017 and 2021, with paresthesias in both arms/hands since these surgeries.  Hx of L reverse TSA in Jan, remote hx of trigger finger release R LF PIP.  Per Rheumatology note on 09/16/24: Ricky Figueroa is a 68 y.o. male is here today for follow up of gout and osteoarthritis. The patient's allergies, current medications, past family history, past medical history, past social history, past surgical history and problem list were reviewed and updated as appropriate.    He is taking the allopurinol. He is tolerating this well. He has no gout flare since last evaluation. He states that over all he is  feeling well.  He is monitoring diet, avoid gout foods. He is followed by spine clinic regarding his degenerative disc disease. He is on naproxen for pain control.    He is getting pain of the hands.He has been to OT many years ago. He does get pain down the arms. He does get paresthesia of the hands.    He wife passed away in 03/13/2024. He has been under stress due to that.   Assessment and Plan    Diagnoses and all orders for this visit:   Idiopathic chronic gout of multiple sites without tophus   Primary osteoarthritis of both hands -     Ambulatory Referral to Occupational Therapy   Multilevel degenerative disc disease -     nortriptyline (PAMELOR) 10 MG capsule; Take 1 capsule (10 mg total) by mouth at bedtime  PRECAUTIONS: None  RED FLAGS: None   WEIGHT BEARING RESTRICTIONS: No  PAIN: 09/28/24: 2-3/10 pain in bilat hands start of session Are you having pain? Yes: NPRS scale: 1-2, up to 7-8/10 at night time or with activity Pain location: R/L shoulders down to the hands Pain description: tingling/shooting Aggravating factors: prolonged positioning, night time Relieving factors: getting up and moving   FALLS: Has patient fallen in last 6 months? No  LIVING ENVIRONMENT: Lives with: lives alone with dog Lives in: 3 level home  Stairs: 5 steps from the garage, rail on the R Has following equipment at home: Single point cane, shower  chair, and Grab bars  PLOF: Independent, retired art gallery manager   PATIENT GOALS: Improve my pain.  NEXT MD VISIT: Pt will return to Dr. Tobie for rheumatology follow up in 3-4 months, and PCP in Feb   OBJECTIVE:  Note: Objective measures were completed at Evaluation unless otherwise noted.  HAND DOMINANCE: Right  ADLs: Eating: utensils occasionally slip from grip, can be challenging to cut food; pt reports stabilizing a fork can be more challenging than a knife. Grooming: indep Upper body dressing: pt reports no difficulties with clothing  fasteners Lower body dressing: indep Toileting: indep Bathing: indep IADLs: increased pain/paresthesias in BUEs with tool use/repetitive tasks/activities which require grasping/pinching/carrying/lifting  UPPER EXTREMITY ROM:  BUEs WFL for ADLs; able to make full composite fists in each hand and oppose each digit to thumb in R/L hands  UPPER EXTREMITY MMT:  Bilat shoulders 4+-5/5, 5/5 throughout bilat elbows/wrists  FUNCTIONAL OUTCOME MEASURES: 09/28/24: MAM-20 for musculoskeletal conditions: 64/80  HAND FUNCTION: Grip strength: Right: 68 lbs; Left: 79 lbs, Lateral pinch: Right: 19 lbs, Left: 24 lbs, and 3 point pinch: Right: 13 lbs, Left: 20 lbs  COORDINATION: 9 Hole Peg test: Right: 34 sec; Left: 35 sec  SENSATION: Tingling in BUEs, more severe in the RUE   EDEMA: No visible edema   COGNITION: Overall cognitive status: Within functional limits for tasks assessed  OBSERVATIONS:  Pt pleasant, cooperative, and eager to improve symptoms of pain in BUEs and weakness in the R hand.  TREATMENT DATE: 09/28/24                                                                                                                           Paraffin:  X10 min for R/L hands for pain management/muscle relaxation in prep for therapeutic exercises.  Therapeutic Exercise: Issued green theraputty and instructed pt in strengthening and coordination exercises for R/L hands, including gross grasping, lateral/2 point/3 point pinching, digit abd/add, and digging coins out of putty.  Able to return demo with intermittent vc for technique to improve quality of movement.  Encouraged completion 5-15 min (to tolerance), 1-2x per day.    Self Care: -Condition management education: Pain management strategies: recommendations for use of paraffin or moist heat packs at home and advised on options to obtain. -HEP review: Issued visual handout for putty exercises and reviewed for carryover.  -Completed MAM-20: see  above for scoring.  Most challenging tasks for pt: taking things out of wallet and brushing teeth (both 2, indicating very hard to do).  Brushing teeth bothered by vibrations of electric toothbrush.  Encouraged standard/manual toothbrush to ease tolerance.  PATIENT EDUCATION: Education details: HEP Person educated: Patient Education method: Programmer, Multimedia, Verbal cues, and Handouts Education comprehension: verbalized understanding, returned demonstration, and verbal cues required  HOME EXERCISE PROGRAM: Landy theraputty  GOALS: Goals reviewed with patient? Yes  SHORT TERM GOALS: Target date: 11/07/24  Pt will be indep to perform HEP for improving R hand strength and maintaining flexibility throughout  BUEs. Baseline: Eval: Not yet initiated Goal status: INITIAL  LONG TERM GOALS: Target date: 12/19/24  Pt will identify and implement 1-2 compensatory strategies and/or pieces of AE in order to ease performance of daily tasks.    Baseline: Eval: Education not yet initiated Goal status: INITIAL  2.  Pt will be indep to verbalize 2-3 joint protection/cumulative trauma prevention strategies to reduce pain/paresthesias in BUEs.  Baseline: Eval: Education not yet initiated Goal status: INITIAL  3.  Pt will tolerate manual therapy, therapeutic modalities, and exercises to decrease pain in BUEs to a reported 4/10 pain or less with activity.   Baseline: Eval: 7-8/10 pain in BUEs Goal status: INITIAL  4.  Pt will increase R grip strength by 10 or more lbs for improving ability to hold and operate power tools with R dominant hand. Baseline: Eval: R 68 lbs (L non-dominant 79 lbs) Goal status: INITIAL  ASSESSMENT:  CLINICAL IMPRESSION: Pt reporting improvements in pain from start to end of session following paraffin wax tx and use of theraputty.  Pt receptive to education for home pain management strategies and plans to locate his moist heat packs used in the past.  Pt demos good understanding of  theraputty exercises with use of visual handout, and demonstrated good tolerance to exercises.  Pt advised to monitor symptoms and time using putty to ensure pt avoids any increases in pain from overuse.  Pt will continue to benefit from education related to joint protection/cumulative trauma prevention, HEP instruction, instruction in AE/activity modification in order to better manage symptoms of chronic conditions noted above which are impacting sleep, ADL, and IADL performance.  PERFORMANCE DEFICITS: in functional skills including ADLs, IADLs, coordination, dexterity, sensation, ROM, strength, pain, flexibility, Fine motor control, body mechanics, decreased knowledge of precautions, decreased knowledge of use of DME, and UE functional use, and psychosocial skills including coping strategies, environmental adaptation, habits, and routines and behaviors.   IMPAIRMENTS: are limiting patient from ADLs, IADLs, rest and sleep, and leisure.   COMORBIDITIES: has co-morbidities such as HTN, DM, hx of cervical spinal surgeries that affects occupational performance. Patient will benefit from skilled OT to address above impairments and improve overall function.  MODIFICATION OR ASSISTANCE TO COMPLETE EVALUATION: No modification of tasks or assist necessary to complete an evaluation.  OT OCCUPATIONAL PROFILE AND HISTORY: Detailed assessment: Review of records and additional review of physical, cognitive, psychosocial history related to current functional performance.  CLINICAL DECISION MAKING: Moderate - several treatment options, min-mod task modification necessary  REHAB POTENTIAL: Good  EVALUATION COMPLEXITY: Moderate    PLAN:  OT FREQUENCY: 1-2x/week  OT DURATION: up to 12 weeks  PLANNED INTERVENTIONS: 97168 OT Re-evaluation, 97535 self care/ADL training, 02889 therapeutic exercise, 97530 therapeutic activity, 97112 neuromuscular re-education, 97140 manual therapy, 97010 moist heat, 97010  cryotherapy, 97034 contrast bath, 97750 Physical Performance Testing, 02239 Orthotic Initial, 97763 Orthotic/Prosthetic subsequent, passive range of motion, psychosocial skills training, energy conservation, coping strategies training, patient/family education, and DME and/or AE instructions  RECOMMENDED OTHER SERVICES: None at this time  CONSULTED AND AGREED WITH PLAN OF CARE: Patient  PLAN FOR NEXT SESSION: Instruct in UE nerve glides, joint protection education   Inocente Blazing, MS, OTR/L  Inocente MARLA Blazing, OT 10/02/2024, 8:51 PM

## 2024-10-04 ENCOUNTER — Ambulatory Visit

## 2024-10-04 DIAGNOSIS — R278 Other lack of coordination: Secondary | ICD-10-CM

## 2024-10-04 DIAGNOSIS — M6281 Muscle weakness (generalized): Secondary | ICD-10-CM

## 2024-10-04 DIAGNOSIS — M19042 Primary osteoarthritis, left hand: Secondary | ICD-10-CM

## 2024-10-04 DIAGNOSIS — M19041 Primary osteoarthritis, right hand: Secondary | ICD-10-CM

## 2024-10-04 NOTE — Therapy (Unsigned)
 OUTPATIENT OCCUPATIONAL THERAPY ORTHO TREATMENT NOTE  Patient Name: Ricky Figueroa MRN: [id number] DOB:15-Jan-1956, 68 y.o., male Today's Date: 10/06/2024  PCP: [facility] REFERRING PROVIDER: Dr. Tobie, Mayur  END OF SESSION:  OT End of Session - 10/04/24      Visit Number 3     Number of Visits 24     Date for Recertification  12/19/24     Progress Note Due on Visit 10     OT Start Time 1015a     OT Stop Time 1100a    OT Time Calculation (min) 45 min     Activity Tolerance Patient tolerated treatment well    Behavior During Therapy WFL for tasks assessed/performed    Past Medical History:  Diagnosis Date   Diabetes mellitus without complication (HCC)    Hypertension    No past surgical history on file. There are no active problems to display for this patient.  ONSET DATE: Worsening symptoms in the last year  REFERRING DIAG: Primary OA, right hand; Primary OA, left hand  THERAPY DIAG:  Muscle weakness (generalized)  Other lack of coordination  Primary osteoarthritis, right hand  Primary osteoarthritis, left hand  Rationale for Evaluation and Treatment: Rehabilitation  SUBJECTIVE:  SUBJECTIVE STATEMENT: Pt reports doing well today and using theraputty consistently. Pt accompanied by: self  PERTINENT HISTORY:  09/26/24: Pt reports having 4 neck surgeries between 2017 and 2021, with paresthesias in both arms/hands since these surgeries.  Hx of L reverse TSA in Jan, remote hx of trigger finger release R LF PIP.  Per Rheumatology note on 09/16/24: Ricky Figueroa is a 68 y.o. male is here today for follow up of gout and osteoarthritis. The patient's allergies, current medications, past family history, past medical history, past social history, past surgical history and problem list were reviewed and updated as appropriate.    He is taking the allopurinol. He is tolerating this well. He has no gout flare since last evaluation. He states that over all he is  feeling well.  He is monitoring diet, avoid gout foods. He is followed by spine clinic regarding his degenerative disc disease. He is on naproxen for pain control.    He is getting pain of the hands.He has been to OT many years ago. He does get pain down the arms. He does get paresthesia of the hands.    He wife passed away in 2024-03-19. He has been under stress due to that.   Assessment and Plan    Diagnoses and all orders for this visit:   Idiopathic chronic gout of multiple sites without tophus   Primary osteoarthritis of both hands -     Ambulatory Referral to Occupational Therapy   Multilevel degenerative disc disease -     nortriptyline (PAMELOR) 10 MG capsule; Take 1 capsule (10 mg total) by mouth at bedtime  PRECAUTIONS: None  RED FLAGS: None   WEIGHT BEARING RESTRICTIONS: No  PAIN: 10/04/24: 1-2/10 pain in bilat hands start of session Are you having pain? Yes: NPRS scale: 1-2, up to 7-8/10 at night time or with activity Pain location: R/L shoulders down to the hands Pain description: tingling/shooting Aggravating factors: prolonged positioning, night time Relieving factors: getting up and moving   FALLS: Has patient fallen in last 6 months? No  LIVING ENVIRONMENT: Lives with: lives alone with dog Lives in: 3 level home  Stairs: 5 steps from the garage, rail on the R Has following equipment at home: Single point cane, shower chair, and  Grab bars  PLOF: Independent, retired art gallery manager   PATIENT GOALS: Improve my pain.  NEXT MD VISIT: Pt will return to Dr. Tobie for rheumatology follow up in 3-4 months, and PCP in Feb   OBJECTIVE:  Note: Objective measures were completed at Evaluation unless otherwise noted.  HAND DOMINANCE: Right  ADLs: Eating: utensils occasionally slip from grip, can be challenging to cut food; pt reports stabilizing a fork can be more challenging than a knife. Grooming: indep Upper body dressing: pt reports no difficulties with clothing  fasteners Lower body dressing: indep Toileting: indep Bathing: indep IADLs: increased pain/paresthesias in BUEs with tool use/repetitive tasks/activities which require grasping/pinching/carrying/lifting  UPPER EXTREMITY ROM:  BUEs WFL for ADLs; able to make full composite fists in each hand and oppose each digit to thumb in R/L hands  UPPER EXTREMITY MMT:  Bilat shoulders 4+-5/5, 5/5 throughout bilat elbows/wrists  FUNCTIONAL OUTCOME MEASURES: 09/28/24: MAM-20 for musculoskeletal conditions: 64/80  HAND FUNCTION: Grip strength: Right: 68 lbs; Left: 79 lbs, Lateral pinch: Right: 19 lbs, Left: 24 lbs, and 3 point pinch: Right: 13 lbs, Left: 20 lbs  COORDINATION: 9 Hole Peg test: Right: 34 sec; Left: 35 sec  SENSATION: Tingling in BUEs, more severe in the RUE   EDEMA: No visible edema   COGNITION: Overall cognitive status: Within functional limits for tasks assessed  OBSERVATIONS:  Pt pleasant, cooperative, and eager to improve symptoms of pain in BUEs and weakness in the R hand.  TREATMENT DATE: 10/04/24                                                                                                                           Paraffin:  X10 min R hand for pain management/muscle relaxation in prep for therapeutic exercises.  Moist heat pack to L hand for same.  Therapeutic Exercise: Instructed in/completed reps of median and ulnar nerve glides R/LUE; min tactile, mod vc Instructed in/completed passive R/L wrist extension stretches with 20 sec holds using edge of table to maximize end range stretch.  Self Care: -Condition management education: Joint protection/activity modification for bilat wrist/hands; reviewed written handout for key principles of joint protection -Reviewed visual handout of nerve glides noted above  PATIENT EDUCATION: Education details: HEP/joint protection strategies Person educated: Patient Education method: Explanation, Verbal cues, and  Handouts Education comprehension: verbalized understanding  HOME EXERCISE PROGRAM: Green theraputty, median and ulnar nerve glides  GOALS: Goals reviewed with patient? Yes  SHORT TERM GOALS: Target date: 11/07/24  Pt will be indep to perform HEP for improving R hand strength and maintaining flexibility throughout BUEs. Baseline: Eval: Not yet initiated Goal status: INITIAL  LONG TERM GOALS: Target date: 12/19/24  Pt will identify and implement 1-2 compensatory strategies and/or pieces of AE in order to ease performance of daily tasks.    Baseline: Eval: Education not yet initiated Goal status: INITIAL  2.  Pt will be indep to verbalize 2-3 joint protection/cumulative trauma prevention strategies to reduce pain/paresthesias in  BUEs.  Baseline: Eval: Education not yet initiated Goal status: INITIAL  3.  Pt will tolerate manual therapy, therapeutic modalities, and exercises to decrease pain in BUEs to a reported 4/10 pain or less with activity.   Baseline: Eval: 7-8/10 pain in BUEs Goal status: INITIAL  4.  Pt will increase R grip strength by 10 or more lbs for improving ability to hold and operate power tools with R dominant hand. Baseline: Eval: R 68 lbs (L non-dominant 79 lbs) Goal status: INITIAL  ASSESSMENT:  CLINICAL IMPRESSION: Pt with good pain reduction using paraffin and moist heat to R/L hands.  Good tolerance to nerve glides and passive wrist stretches noted above, requiring vc and demo to keep exercises within pain free ranges.  Pt will continue to benefit from education related to joint protection/cumulative trauma prevention, HEP instruction, instruction in AE/activity modification in order to better manage symptoms of chronic conditions noted above which are impacting sleep, ADL, and IADL performance.  PERFORMANCE DEFICITS: in functional skills including ADLs, IADLs, coordination, dexterity, sensation, ROM, strength, pain, flexibility, Fine motor control, body  mechanics, decreased knowledge of precautions, decreased knowledge of use of DME, and UE functional use, and psychosocial skills including coping strategies, environmental adaptation, habits, and routines and behaviors.   IMPAIRMENTS: are limiting patient from ADLs, IADLs, rest and sleep, and leisure.   COMORBIDITIES: has co-morbidities such as HTN, DM, hx of cervical spinal surgeries that affects occupational performance. Patient will benefit from skilled OT to address above impairments and improve overall function.  MODIFICATION OR ASSISTANCE TO COMPLETE EVALUATION: No modification of tasks or assist necessary to complete an evaluation.  OT OCCUPATIONAL PROFILE AND HISTORY: Detailed assessment: Review of records and additional review of physical, cognitive, psychosocial history related to current functional performance.  CLINICAL DECISION MAKING: Moderate - several treatment options, min-mod task modification necessary  REHAB POTENTIAL: Good  EVALUATION COMPLEXITY: Moderate    PLAN:  OT FREQUENCY: 1-2x/week  OT DURATION: up to 12 weeks  PLANNED INTERVENTIONS: 97168 OT Re-evaluation, 97535 self care/ADL training, 02889 therapeutic exercise, 97530 therapeutic activity, 97112 neuromuscular re-education, 97140 manual therapy, 97010 moist heat, 97010 cryotherapy, 97034 contrast bath, 97750 Physical Performance Testing, 02239 Orthotic Initial, 97763 Orthotic/Prosthetic subsequent, passive range of motion, psychosocial skills training, energy conservation, coping strategies training, patient/family education, and DME and/or AE instructions  RECOMMENDED OTHER SERVICES: None at this time  CONSULTED AND AGREED WITH PLAN OF CARE: Patient  PLAN FOR NEXT SESSION: hand strengthening, pain management, ROM  Inocente Blazing, MS, OTR/L  Inocente MARLA Blazing, OT 10/06/2024, 6:47 PM

## 2024-10-06 ENCOUNTER — Ambulatory Visit

## 2024-10-06 DIAGNOSIS — M6281 Muscle weakness (generalized): Secondary | ICD-10-CM | POA: Diagnosis not present

## 2024-10-06 DIAGNOSIS — M19041 Primary osteoarthritis, right hand: Secondary | ICD-10-CM

## 2024-10-06 DIAGNOSIS — M19042 Primary osteoarthritis, left hand: Secondary | ICD-10-CM

## 2024-10-06 DIAGNOSIS — R278 Other lack of coordination: Secondary | ICD-10-CM

## 2024-10-06 NOTE — Therapy (Signed)
 OUTPATIENT OCCUPATIONAL THERAPY ORTHO TREATMENT NOTE  Patient Name: Biagio Snelson MRN: [id number] DOB:Oct 03, 1956, 68 y.o., male Today's Date: 10/06/2024  PCP: [facility] REFERRING PROVIDER: Dr. Tobie, Mayur  END OF SESSION:  OT End of Session - 10/06/24 1902     Visit Number 4    Number of Visits 24    Date for Recertification  12/19/24    Progress Note Due on Visit 10    OT Start Time 0930    OT Stop Time 1015    OT Time Calculation (min) 45 min    Activity Tolerance Patient tolerated treatment well    Behavior During Therapy WFL for tasks assessed/performed         Past Medical History:  Diagnosis Date   Diabetes mellitus without complication (HCC)    Hypertension    No past surgical history on file. There are no active problems to display for this patient.  ONSET DATE: Worsening symptoms in the last year  REFERRING DIAG: Primary OA, right hand; Primary OA, left hand  THERAPY DIAG:  Muscle weakness (generalized)  Other lack of coordination  Primary osteoarthritis, right hand  Primary osteoarthritis, left hand  Rationale for Evaluation and Treatment: Rehabilitation  SUBJECTIVE:  SUBJECTIVE STATEMENT: Pt reported irritation to L IF nail bed; avoided paraffin to this hand today d/t small open wound.   Pt accompanied by: self  PERTINENT HISTORY:  09/26/24: Pt reports having 4 neck surgeries between 2017 and 2021, with paresthesias in both arms/hands since these surgeries.  Hx of L reverse TSA in Jan, remote hx of trigger finger release R LF PIP.  Per Rheumatology note on 09/16/24: Anthonie Lotito is a 68 y.o. male is here today for follow up of gout and osteoarthritis. The patient's allergies, current medications, past family history, past medical history, past social history, past surgical history and problem list were reviewed and updated as appropriate.    He is taking the allopurinol. He is tolerating this well. He has no gout flare since last  evaluation. He states that over all he is feeling well.  He is monitoring diet, avoid gout foods. He is followed by spine clinic regarding his degenerative disc disease. He is on naproxen for pain control.    He is getting pain of the hands.He has been to OT many years ago. He does get pain down the arms. He does get paresthesia of the hands.    He wife passed away in Mar 19, 2024. He has been under stress due to that.   Assessment and Plan    Diagnoses and all orders for this visit:   Idiopathic chronic gout of multiple sites without tophus   Primary osteoarthritis of both hands -     Ambulatory Referral to Occupational Therapy   Multilevel degenerative disc disease -     nortriptyline (PAMELOR) 10 MG capsule; Take 1 capsule (10 mg total) by mouth at bedtime  PRECAUTIONS: None  RED FLAGS: None   WEIGHT BEARING RESTRICTIONS: No  PAIN: 10/06/24: 1/10 pain in bilat hands  Are you having pain? Yes: NPRS scale: 1-2, up to 7-8/10 at night time or with activity Pain location: R/L shoulders down to the hands Pain description: tingling/shooting Aggravating factors: prolonged positioning, night time Relieving factors: getting up and moving   FALLS: Has patient fallen in last 6 months? No  LIVING ENVIRONMENT: Lives with: lives alone with dog Lives in: 3 level home  Stairs: 5 steps from the garage, rail on the R Has following equipment  at home: Single point cane, shower chair, and Grab bars  PLOF: Independent, retired art gallery manager   PATIENT GOALS: Improve my pain.  NEXT MD VISIT: Pt will return to Dr. Tobie for rheumatology follow up in 3-4 months, and PCP in Feb   OBJECTIVE:  Note: Objective measures were completed at Evaluation unless otherwise noted.  HAND DOMINANCE: Right  ADLs: Eating: utensils occasionally slip from grip, can be challenging to cut food; pt reports stabilizing a fork can be more challenging than a knife. Grooming: indep Upper body dressing: pt reports no  difficulties with clothing fasteners Lower body dressing: indep Toileting: indep Bathing: indep IADLs: increased pain/paresthesias in BUEs with tool use/repetitive tasks/activities which require grasping/pinching/carrying/lifting  UPPER EXTREMITY ROM:  BUEs WFL for ADLs; able to make full composite fists in each hand and oppose each digit to thumb in R/L hands  UPPER EXTREMITY MMT:  Bilat shoulders 4+-5/5, 5/5 throughout bilat elbows/wrists  FUNCTIONAL OUTCOME MEASURES: 09/28/24: MAM-20 for musculoskeletal conditions: 64/80  HAND FUNCTION: Grip strength: Right: 68 lbs; Left: 79 lbs, Lateral pinch: Right: 19 lbs, Left: 24 lbs, and 3 point pinch: Right: 13 lbs, Left: 20 lbs  COORDINATION: 9 Hole Peg test: Right: 34 sec; Left: 35 sec  SENSATION: Tingling in BUEs, more severe in the RUE   EDEMA: No visible edema   COGNITION: Overall cognitive status: Within functional limits for tasks assessed  OBSERVATIONS:  Pt pleasant, cooperative, and eager to improve symptoms of pain in BUEs and weakness in the R hand.  TREATMENT DATE: 10/06/24                                                                                                                           Paraffin:  X10 min R hand for pain management/muscle relaxation in prep for therapeutic exercises.  Moist heat pack to L hand for same.  Therapeutic Exercise: -Instructed in/completed dowel stretches for bilat shoulder planes, including chest press, shoulder flex, ER directing dowel toward forehead, shoulder abd, and IR and extension behind back.  Vc for form technique- head forward, avoiding bending at the hips x10 reps each -Instructed in/completed tendon glides for R/L hands, passively and actively; mod vc for technique -R grip strengthening: Pt removed jumbo pegs from pegboard using hand gripper set at 28.9# for 2 trials  Self Care: -Review of visual handouts for dowel stretches and tendon glides.   -Review of performing all  exercises within tolerable/pain free ranges -Instructed in use of table top for tendon gliding positions to achieve joint blocking and increasing end range stretch  PATIENT EDUCATION: Education details: HEP progression Person educated: Patient Education method: Explanation, Verbal cues, and Handouts Education comprehension: verbalized understanding  HOME EXERCISE PROGRAM: Green theraputty, median and ulnar nerve glides, tendon glides, dowel stretches  GOALS: Goals reviewed with patient? Yes  SHORT TERM GOALS: Target date: 11/07/24  Pt will be indep to perform HEP for improving R hand strength and maintaining flexibility throughout BUEs. Baseline: Eval: Not  yet initiated Goal status: INITIAL  LONG TERM GOALS: Target date: 12/19/24  Pt will identify and implement 1-2 compensatory strategies and/or pieces of AE in order to ease performance of daily tasks.    Baseline: Eval: Education not yet initiated Goal status: INITIAL  2.  Pt will be indep to verbalize 2-3 joint protection/cumulative trauma prevention strategies to reduce pain/paresthesias in BUEs.  Baseline: Eval: Education not yet initiated Goal status: INITIAL  3.  Pt will tolerate manual therapy, therapeutic modalities, and exercises to decrease pain in BUEs to a reported 4/10 pain or less with activity.   Baseline: Eval: 7-8/10 pain in BUEs Goal status: INITIAL  4.  Pt will increase R grip strength by 10 or more lbs for improving ability to hold and operate power tools with R dominant hand. Baseline: Eval: R 68 lbs (L non-dominant 79 lbs) Goal status: INITIAL  ASSESSMENT:  CLINICAL IMPRESSION: Pt continues to respond well to paraffin and moist heat for pain management in bilat hands.  Good tolerance to above noted exercises with min-mod vc for form/technique.  Good ability to follow visual handouts.  Limited pegs/hand gripper to 2 trials to prevent joint pain from high reps. Pt will continue to benefit from education  related to joint protection/cumulative trauma prevention, R hand strengthening, pain management, activities to increase ROM, HEP instruction, instruction in AE/activity modification in order to better manage symptoms of chronic conditions noted above which are impacting sleep, ADL, and IADL performance.  PERFORMANCE DEFICITS: in functional skills including ADLs, IADLs, coordination, dexterity, sensation, ROM, strength, pain, flexibility, Fine motor control, body mechanics, decreased knowledge of precautions, decreased knowledge of use of DME, and UE functional use, and psychosocial skills including coping strategies, environmental adaptation, habits, and routines and behaviors.   IMPAIRMENTS: are limiting patient from ADLs, IADLs, rest and sleep, and leisure.   COMORBIDITIES: has co-morbidities such as HTN, DM, hx of cervical spinal surgeries that affects occupational performance. Patient will benefit from skilled OT to address above impairments and improve overall function.  MODIFICATION OR ASSISTANCE TO COMPLETE EVALUATION: No modification of tasks or assist necessary to complete an evaluation.  OT OCCUPATIONAL PROFILE AND HISTORY: Detailed assessment: Review of records and additional review of physical, cognitive, psychosocial history related to current functional performance.  CLINICAL DECISION MAKING: Moderate - several treatment options, min-mod task modification necessary  REHAB POTENTIAL: Good  EVALUATION COMPLEXITY: Moderate    PLAN:  OT FREQUENCY: 1-2x/week  OT DURATION: up to 12 weeks  PLANNED INTERVENTIONS: 97168 OT Re-evaluation, 97535 self care/ADL training, 02889 therapeutic exercise, 97530 therapeutic activity, 97112 neuromuscular re-education, 97140 manual therapy, 97010 moist heat, 97010 cryotherapy, 97034 contrast bath, 97750 Physical Performance Testing, 02239 Orthotic Initial, 97763 Orthotic/Prosthetic subsequent, passive range of motion, psychosocial skills training,  energy conservation, coping strategies training, patient/family education, and DME and/or AE instructions  RECOMMENDED OTHER SERVICES: None at this time  CONSULTED AND AGREED WITH PLAN OF CARE: Patient  PLAN FOR NEXT SESSION: hand strengthening, pain management, ROM  Inocente Blazing, MS, OTR/L  Inocente MARLA Blazing, OT 10/06/2024, 7:04 PM

## 2024-10-11 ENCOUNTER — Ambulatory Visit: Attending: Rheumatology

## 2024-10-11 DIAGNOSIS — M6281 Muscle weakness (generalized): Secondary | ICD-10-CM | POA: Insufficient documentation

## 2024-10-11 DIAGNOSIS — R278 Other lack of coordination: Secondary | ICD-10-CM | POA: Insufficient documentation

## 2024-10-11 DIAGNOSIS — M19042 Primary osteoarthritis, left hand: Secondary | ICD-10-CM | POA: Insufficient documentation

## 2024-10-11 DIAGNOSIS — M19041 Primary osteoarthritis, right hand: Secondary | ICD-10-CM | POA: Insufficient documentation

## 2024-10-11 NOTE — Therapy (Signed)
 OUTPATIENT OCCUPATIONAL THERAPY ORTHO TREATMENT NOTE  Patient Name: Ricky Figueroa MRN: [id number] DOB:06-Apr-1956, 68 y.o., male Today's Date: 10/11/2024  PCP: [facility] REFERRING PROVIDER: Dr. Tobie, Mayur  END OF SESSION:  OT End of Session - 10/11/24 1628     Visit Number 5    Number of Visits 24    Date for Recertification  12/19/24    Progress Note Due on Visit 10    OT Start Time 1015    OT Stop Time 1100    OT Time Calculation (min) 45 min    Activity Tolerance Patient tolerated treatment well    Behavior During Therapy WFL for tasks assessed/performed         Past Medical History:  Diagnosis Date   Diabetes mellitus without complication (HCC)    Hypertension    No past surgical history on file. There are no active problems to display for this patient.  ONSET DATE: Worsening symptoms in the last year  REFERRING DIAG: Primary OA, right hand; Primary OA, left hand  THERAPY DIAG:  Muscle weakness (generalized)  Other lack of coordination  Primary osteoarthritis, right hand  Primary osteoarthritis, left hand  Rationale for Evaluation and Treatment: Rehabilitation  SUBJECTIVE:  SUBJECTIVE STATEMENT: Pt noted an open sore on his R thumb today.  Used moist hot packs today in place of Paraffin.   Pt accompanied by: self  PERTINENT HISTORY:  09/26/24: Pt reports having 4 neck surgeries between 2017 and 2021, with paresthesias in both arms/hands since these surgeries.  Hx of L reverse TSA in Jan, remote hx of trigger finger release R LF PIP.  Per Rheumatology note on 09/16/24: Ricky Figueroa is a 68 y.o. male is here today for follow up of gout and osteoarthritis. The patient's allergies, current medications, past family history, past medical history, past social history, past surgical history and problem list were reviewed and updated as appropriate.    He is taking the allopurinol. He is tolerating this well. He has no gout flare since last  evaluation. He states that over all he is feeling well.  He is monitoring diet, avoid gout foods. He is followed by spine clinic regarding his degenerative disc disease. He is on naproxen for pain control.    He is getting pain of the hands.He has been to OT many years ago. He does get pain down the arms. He does get paresthesia of the hands.    He wife passed away in 2024-03-19. He has been under stress due to that.   Assessment and Plan    Diagnoses and all orders for this visit:   Idiopathic chronic gout of multiple sites without tophus   Primary osteoarthritis of both hands -     Ambulatory Referral to Occupational Therapy   Multilevel degenerative disc disease -     nortriptyline (PAMELOR) 10 MG capsule; Take 1 capsule (10 mg total) by mouth at bedtime  PRECAUTIONS: None  RED FLAGS: None   WEIGHT BEARING RESTRICTIONS: No  PAIN: 10/11/24: 1-2/10 pain in bilat hands, sore Are you having pain? Yes: NPRS scale: 1-2, up to 7-8/10 at night time or with activity Pain location: R/L shoulders down to the hands Pain description: tingling/shooting Aggravating factors: prolonged positioning, night time Relieving factors: getting up and moving   FALLS: Has patient fallen in last 6 months? No  LIVING ENVIRONMENT: Lives with: lives alone with dog Lives in: 3 level home  Stairs: 5 steps from the garage, rail on the R Has  following equipment at home: Single point cane, shower chair, and Grab bars  PLOF: Independent, retired art gallery manager   PATIENT GOALS: Improve my pain.  NEXT MD VISIT: Pt will return to Dr. Tobie for rheumatology follow up in 3-4 months, and PCP in Feb   OBJECTIVE:  Note: Objective measures were completed at Evaluation unless otherwise noted.  HAND DOMINANCE: Right  ADLs: Eating: utensils occasionally slip from grip, can be challenging to cut food; pt reports stabilizing a fork can be more challenging than a knife. Grooming: indep Upper body dressing: pt reports  no difficulties with clothing fasteners Lower body dressing: indep Toileting: indep Bathing: indep IADLs: increased pain/paresthesias in BUEs with tool use/repetitive tasks/activities which require grasping/pinching/carrying/lifting  UPPER EXTREMITY ROM:  BUEs WFL for ADLs; able to make full composite fists in each hand and oppose each digit to thumb in R/L hands  UPPER EXTREMITY MMT:  Bilat shoulders 4+-5/5, 5/5 throughout bilat elbows/wrists  FUNCTIONAL OUTCOME MEASURES: 09/28/24: MAM-20 for musculoskeletal conditions: 64/80  HAND FUNCTION: Grip strength: Right: 68 lbs; Left: 79 lbs, Lateral pinch: Right: 19 lbs, Left: 24 lbs, and 3 point pinch: Right: 13 lbs, Left: 20 lbs  COORDINATION: 9 Hole Peg test: Right: 34 sec; Left: 35 sec  SENSATION: Tingling in BUEs, more severe in the RUE   EDEMA: No visible edema   COGNITION: Overall cognitive status: Within functional limits for tasks assessed  OBSERVATIONS:  Pt pleasant, cooperative, and eager to improve symptoms of pain in BUEs and weakness in the R hand.  TREATMENT DATE: 10/11/24                                                                                                                           Moist heat applied to R/L hands x5 min for pain reduction/muscle relaxation in prep for therapeutic exercises noted below.  Used intermittently throughout session during rest periods from exercises.   Therapeutic Exercise: -Performed passive stretching to bilat hands: wrist flex/ext, MP, PIP/DIP joint flex/ext of all digits, working towards increased joint flexibility for daily tasks. -R/L grip strengthening: Pt removed jumbo pegs from pegboard using hand gripper set at 28.9# for 2 trials, and 23.4# for 1 trial, alternating hands between each set.  Constant monitoring for tolerance to resistance and reps.  Self Care: -Condition management education: Recommendation to obtain anti-vibration work gloves for house/yard work d/t  vibration from power tools/yard equipment contribute to increased pain/tingling throughout BUEs.  Advised on options to obtain. -Reviewed optimal sleep positions to reduce BUE paresthesias: avoid extreme flexion at the elbows and wrists; recommended positioning with pillows as needed  PATIENT EDUCATION: Education details: Condition management education Person educated: Patient Education method: Explanation and Verbal cues Education comprehension: verbalized understanding  HOME EXERCISE PROGRAM: Green theraputty, median and ulnar nerve glides, tendon glides, dowel stretches  GOALS: Goals reviewed with patient? Yes  SHORT TERM GOALS: Target date: 11/07/24  Pt will be indep to perform HEP for improving R hand strength and  maintaining flexibility throughout BUEs. Baseline: Eval: Not yet initiated Goal status: INITIAL  LONG TERM GOALS: Target date: 12/19/24  Pt will identify and implement 1-2 compensatory strategies and/or pieces of AE in order to ease performance of daily tasks.    Baseline: Eval: Education not yet initiated Goal status: INITIAL  2.  Pt will be indep to verbalize 2-3 joint protection/cumulative trauma prevention strategies to reduce pain/paresthesias in BUEs.  Baseline: Eval: Education not yet initiated Goal status: INITIAL  3.  Pt will tolerate manual therapy, therapeutic modalities, and exercises to decrease pain in BUEs to a reported 4/10 pain or less with activity.   Baseline: Eval: 7-8/10 pain in BUEs Goal status: INITIAL  4.  Pt will increase R grip strength by 10 or more lbs for improving ability to hold and operate power tools with R dominant hand. Baseline: Eval: R 68 lbs (L non-dominant 79 lbs) Goal status: INITIAL  ASSESSMENT:  CLINICAL IMPRESSION: Pt making steady gains towards OT goals, noting substantial increase in bilat grip strength: R/L grip each 90# on dynamometer today.  Moist heat used today in place of paraffin d/t open sore on R thumb and L  hand (nail bed).  Pt with good tolerance to passive stretching this date to bilat wrists and hands with use of moist heat for muscle relaxation.  Pt's pain has consistently been minimal on tx days (1-2), though can still increase to 6-7/10 with household tasks at times.  Pt receptive to recommendations made today for sleep positioning and anti-vibration gloves as both activities (sleep and use of power tools/yard equipment) can contribute to increased pain/paresthesias in BUEs.  Pt will continue to benefit from education related to joint protection/cumulative trauma prevention, R hand strengthening, pain management, activities to increase ROM, HEP instruction, instruction in AE/activity modification in order to better manage symptoms of chronic conditions noted above which are impacting sleep, ADL, and IADL performance.  PERFORMANCE DEFICITS: in functional skills including ADLs, IADLs, coordination, dexterity, sensation, ROM, strength, pain, flexibility, Fine motor control, body mechanics, decreased knowledge of precautions, decreased knowledge of use of DME, and UE functional use, and psychosocial skills including coping strategies, environmental adaptation, habits, and routines and behaviors.   IMPAIRMENTS: are limiting patient from ADLs, IADLs, rest and sleep, and leisure.   COMORBIDITIES: has co-morbidities such as HTN, DM, hx of cervical spinal surgeries that affects occupational performance. Patient will benefit from skilled OT to address above impairments and improve overall function.  MODIFICATION OR ASSISTANCE TO COMPLETE EVALUATION: No modification of tasks or assist necessary to complete an evaluation.  OT OCCUPATIONAL PROFILE AND HISTORY: Detailed assessment: Review of records and additional review of physical, cognitive, psychosocial history related to current functional performance.  CLINICAL DECISION MAKING: Moderate - several treatment options, min-mod task modification necessary  REHAB  POTENTIAL: Good  EVALUATION COMPLEXITY: Moderate    PLAN:  OT FREQUENCY: 1-2x/week  OT DURATION: up to 12 weeks  PLANNED INTERVENTIONS: 97168 OT Re-evaluation, 97535 self care/ADL training, 02889 therapeutic exercise, 97530 therapeutic activity, 97112 neuromuscular re-education, 97140 manual therapy, 97010 moist heat, 97010 cryotherapy, 97034 contrast bath, 97750 Physical Performance Testing, 02239 Orthotic Initial, 97763 Orthotic/Prosthetic subsequent, passive range of motion, psychosocial skills training, energy conservation, coping strategies training, patient/family education, and DME and/or AE instructions  RECOMMENDED OTHER SERVICES: None at this time  CONSULTED AND AGREED WITH PLAN OF CARE: Patient  PLAN FOR NEXT SESSION: hand strengthening, pain management, ROM  Inocente Blazing, MS, OTR/L  Inocente MARLA Blazing, OT 10/11/2024, 4:33 PM

## 2024-10-17 ENCOUNTER — Ambulatory Visit

## 2024-10-17 DIAGNOSIS — M19041 Primary osteoarthritis, right hand: Secondary | ICD-10-CM

## 2024-10-17 DIAGNOSIS — R278 Other lack of coordination: Secondary | ICD-10-CM

## 2024-10-17 DIAGNOSIS — M19042 Primary osteoarthritis, left hand: Secondary | ICD-10-CM

## 2024-10-17 DIAGNOSIS — M6281 Muscle weakness (generalized): Secondary | ICD-10-CM | POA: Diagnosis not present

## 2024-10-17 NOTE — Therapy (Signed)
 OUTPATIENT OCCUPATIONAL THERAPY ORTHO TREATMENT NOTE  Patient Name: Ricky Figueroa MRN: [id number] DOB:01-29-1956, 68 y.o., male Today's Date: 10/17/2024  PCP: [facility] REFERRING PROVIDER: Dr. Tobie, Mayur  END OF SESSION:  OT End of Session - 10/17/24 1022     Visit Number 6    Number of Visits 24    Date for Recertification  12/19/24    Progress Note Due on Visit 10    OT Start Time 0847    OT Stop Time 0930    OT Time Calculation (min) 43 min    Activity Tolerance Patient tolerated treatment well    Behavior During Therapy [facility] for tasks assessed/performed         Past Medical History:  Diagnosis Date   Diabetes mellitus without complication (HCC)    Hypertension    No past surgical history on file. There are no active problems to display for this patient.  ONSET DATE: Worsening symptoms in the last year  REFERRING DIAG: Primary OA, right hand; Primary OA, left hand  THERAPY DIAG:  Muscle weakness (generalized)  Other lack of coordination  Primary osteoarthritis, right hand  Primary osteoarthritis, left hand  Rationale for Evaluation and Treatment: Rehabilitation  SUBJECTIVE:  SUBJECTIVE STATEMENT: Pt reports he saw the orthopedist on Friday and he has recommended L TKA.   Pt accompanied by: self  PERTINENT HISTORY:  09/26/24: Pt reports having 4 neck surgeries between 2017 and 2021, with paresthesias in both arms/hands since these surgeries.  Hx of L reverse TSA in Jan, remote hx of trigger finger release R LF PIP.  Per Rheumatology note on 09/16/24: Ricky Figueroa is a 68 y.o. male is here today for follow up of gout and osteoarthritis. The patient's allergies, current medications, past family history, past medical history, past social history, past surgical history and problem list were reviewed and updated as appropriate.    He is taking the allopurinol. He is tolerating this well. He has no gout flare since last evaluation. He states that over  all he is feeling well.  He is monitoring diet, avoid gout foods. He is followed by spine clinic regarding his degenerative disc disease. He is on naproxen for pain control.    He is getting pain of the hands.He has been to OT many years ago. He does get pain down the arms. He does get paresthesia of the hands.    He wife passed away in 08-Apr-2024. He has been under stress due to that.   Assessment and Plan    Diagnoses and all orders for this visit:   Idiopathic chronic gout of multiple sites without tophus   Primary osteoarthritis of both hands -     Ambulatory Referral to Occupational Therapy   Multilevel degenerative disc disease -     nortriptyline (PAMELOR) 10 MG capsule; Take 1 capsule (10 mg total) by mouth at bedtime  PRECAUTIONS: None  RED FLAGS: None   WEIGHT BEARING RESTRICTIONS: No  PAIN: 10/17/24: 0-1/10 pain in bilat hands, sore, L knee sore Are you having pain? Yes: NPRS scale: 1-2, up to 7-8/10 at night time or with activity Pain location: R/L shoulders down to the hands Pain description: tingling/shooting Aggravating factors: prolonged positioning, night time Relieving factors: getting up and moving   FALLS: Has patient fallen in last 6 months? No  LIVING ENVIRONMENT: Lives with: lives alone with dog Lives in: 3 level home  Stairs: 5 steps from the garage, rail on the R Has following equipment at  home: Single point cane, shower chair, and Grab bars  PLOF: Independent, retired art gallery manager   PATIENT GOALS: Improve my pain.  NEXT MD VISIT: Pt will return to Dr. Tobie for rheumatology follow up in 3-4 months, and PCP in Feb   OBJECTIVE:  Note: Objective measures were completed at Evaluation unless otherwise noted.  HAND DOMINANCE: Right  ADLs: Eating: utensils occasionally slip from grip, can be challenging to cut food; pt reports stabilizing a fork can be more challenging than a knife. Grooming: indep Upper body dressing: pt reports no difficulties  with clothing fasteners Lower body dressing: indep Toileting: indep Bathing: indep IADLs: increased pain/paresthesias in BUEs with tool use/repetitive tasks/activities which require grasping/pinching/carrying/lifting  UPPER EXTREMITY ROM:  BUEs WFL for ADLs; able to make full composite fists in each hand and oppose each digit to thumb in R/L hands  UPPER EXTREMITY MMT:  Bilat shoulders 4+-5/5, 5/5 throughout bilat elbows/wrists  FUNCTIONAL OUTCOME MEASURES: 09/28/24: MAM-20 for musculoskeletal conditions: 64/80  HAND FUNCTION: Grip strength: Right: 68 lbs; Left: 79 lbs, Lateral pinch: Right: 19 lbs, Left: 24 lbs, and 3 point pinch: Right: 13 lbs, Left: 20 lbs  COORDINATION: 9 Hole Peg test: Right: 34 sec; Left: 35 sec  SENSATION: Tingling in BUEs, more severe in the RUE   EDEMA: No visible edema   COGNITION: Overall cognitive status: Within functional limits for tasks assessed  OBSERVATIONS:  Pt pleasant, cooperative, and eager to improve symptoms of pain in BUEs and weakness in the R hand.  TREATMENT DATE: 10/17/24                                                                                                                           Moist heat applied to R/L hands x5 min for pain reduction/muscle relaxation in prep for therapeutic exercises noted below.  Used intermittently throughout session during rest periods from exercises.   Self Care: -Condition management education: Questions answered related to pre-op/post op TKA care and rehab options.  Recommendations provided for post op planning related to easing ADL/IADLs and possibly HH therapy prior to transition to outpatient therapy.  Therapeutic Exercise: -Performed passive stretching to bilat hands: wrist flex/ext, MP, PIP/DIP joint flex/ext of all digits, working towards increased joint flexibility for daily tasks. -R/L grip strengthening: Pt removed jumbo pegs from pegboard using hand gripper set at 28.9# for 2 trials,  alternating hands between each set.  Constant monitoring for tolerance to resistance and reps. -R hand pinch strengthening: Pt worked with green, blue, and black resistive clothespins, moving pins on/off a vertical dowel using a lateral and 3 point pinch for 2 trials each; min vc for pinch prehension patterns with constant monitoring for tolerance to resistance and reps.  PATIENT EDUCATION: Education details: Condition management education Person educated: Patient Education method: Explanation and Verbal cues Education comprehension: verbalized understanding  HOME EXERCISE PROGRAM: Green theraputty, median and ulnar nerve glides, tendon glides, dowel stretches  GOALS: Goals reviewed with patient? Yes  SHORT TERM GOALS: Target date: 11/07/24  Pt will be indep to perform HEP for improving R hand strength and maintaining flexibility throughout BUEs. Baseline: Eval: Not yet initiated Goal status: INITIAL  LONG TERM GOALS: Target date: 12/19/24  Pt will identify and implement 1-2 compensatory strategies and/or pieces of AE in order to ease performance of daily tasks.    Baseline: Eval: Education not yet initiated Goal status: INITIAL  2.  Pt will be indep to verbalize 2-3 joint protection/cumulative trauma prevention strategies to reduce pain/paresthesias in BUEs.  Baseline: Eval: Education not yet initiated Goal status: INITIAL  3.  Pt will tolerate manual therapy, therapeutic modalities, and exercises to decrease pain in BUEs to a reported 4/10 pain or less with activity.   Baseline: Eval: 7-8/10 pain in BUEs Goal status: INITIAL  4.  Pt will increase R grip strength by 10 or more lbs for improving ability to hold and operate power tools with R dominant hand. Baseline: Eval: R 68 lbs (L non-dominant 79 lbs) Goal status: INITIAL  ASSESSMENT:  CLINICAL IMPRESSION: Pt continues to make steady gains towards OT goals.  Pt feels like the BUE stretching has improved pain levels.  Pt  does have follow up appt planned with pain clinic in Dec, and has had injections in the past which have been helpful.  Pt reports MD has discussed an alternative injection, which may be discussed at follow up appt in Dec.  Pt also reports he had an orthopedic appt for his L knee on Friday, and MD is recommending L TKA.  Pt had questions related to pre-op/post-op care related to rehab which OT addressed.  Pre op/post-op ADL/IADL recommendations also reviewed with pt.  Pt verbalized understanding of all education provided.  Pt will continue to benefit from education related to joint protection/cumulative trauma prevention, R hand strengthening, pain management, activities to increase ROM, HEP instruction, instruction in AE/activity modification in order to better manage symptoms of chronic conditions noted above which are impacting sleep, ADL, and IADL performance.  PERFORMANCE DEFICITS: in functional skills including ADLs, IADLs, coordination, dexterity, sensation, ROM, strength, pain, flexibility, Fine motor control, body mechanics, decreased knowledge of precautions, decreased knowledge of use of DME, and UE functional use, and psychosocial skills including coping strategies, environmental adaptation, habits, and routines and behaviors.   IMPAIRMENTS: are limiting patient from ADLs, IADLs, rest and sleep, and leisure.   COMORBIDITIES: has co-morbidities such as HTN, DM, hx of cervical spinal surgeries that affects occupational performance. Patient will benefit from skilled OT to address above impairments and improve overall function.  MODIFICATION OR ASSISTANCE TO COMPLETE EVALUATION: No modification of tasks or assist necessary to complete an evaluation.  OT OCCUPATIONAL PROFILE AND HISTORY: Detailed assessment: Review of records and additional review of physical, cognitive, psychosocial history related to current functional performance.  CLINICAL DECISION MAKING: Moderate - several treatment options,  min-mod task modification necessary  REHAB POTENTIAL: Good  EVALUATION COMPLEXITY: Moderate    PLAN:  OT FREQUENCY: 1-2x/week  OT DURATION: up to 12 weeks  PLANNED INTERVENTIONS: 97168 OT Re-evaluation, 97535 self care/ADL training, 02889 therapeutic exercise, 97530 therapeutic activity, 97112 neuromuscular re-education, 97140 manual therapy, 97010 moist heat, 97010 cryotherapy, 97034 contrast bath, 97750 Physical Performance Testing, 02239 Orthotic Initial, 97763 Orthotic/Prosthetic subsequent, passive range of motion, psychosocial skills training, energy conservation, coping strategies training, patient/family education, and DME and/or AE instructions  RECOMMENDED OTHER SERVICES: None at this time  CONSULTED AND AGREED WITH PLAN OF CARE: Patient  PLAN FOR NEXT  SESSION: hand strengthening, pain management, ROM  Inocente Blazing, MS, OTR/L  Inocente MARLA Blazing, OT 10/17/2024, 10:24 AM

## 2024-10-19 ENCOUNTER — Ambulatory Visit

## 2024-10-19 DIAGNOSIS — R278 Other lack of coordination: Secondary | ICD-10-CM

## 2024-10-19 DIAGNOSIS — M6281 Muscle weakness (generalized): Secondary | ICD-10-CM

## 2024-10-19 DIAGNOSIS — M19042 Primary osteoarthritis, left hand: Secondary | ICD-10-CM

## 2024-10-19 DIAGNOSIS — M19041 Primary osteoarthritis, right hand: Secondary | ICD-10-CM

## 2024-10-19 NOTE — Therapy (Signed)
 OUTPATIENT OCCUPATIONAL THERAPY ORTHO TREATMENT NOTE  Patient Name: KATRELL MILHORN MRN: [id number] DOB:09-23-1956, 68 y.o., male Today's Date: 10/19/2024  PCP: Sherial REFERRING PROVIDER: Dr. Tobie, Mayur  END OF SESSION:  OT End of Session - 10/19/24 0859     Visit Number 7    Number of Visits 24    Date for Recertification  12/19/24    OT Start Time 0845    OT Stop Time 0930    OT Time Calculation (min) 45 min    Activity Tolerance Patient tolerated treatment well    Behavior During Therapy WFL for tasks assessed/performed         Past Medical History:  Diagnosis Date   Diabetes mellitus without complication (HCC)    Hypertension    No past surgical history on file. There are no active problems to display for this patient.  ONSET DATE: Worsening symptoms in the last year  REFERRING DIAG: Primary OA, right hand; Primary OA, left hand  THERAPY DIAG:  Muscle weakness (generalized)  Other lack of coordination  Primary osteoarthritis, right hand  Primary osteoarthritis, left hand  Rationale for Evaluation and Treatment: Rehabilitation  SUBJECTIVE:  SUBJECTIVE STATEMENT: Pt in agreement to continue HEP at home and reassess symptom management and ADL tolerance in Dec after follow up visit with pain management MD.   Pt accompanied by: self  PERTINENT HISTORY:  09/26/24: Pt reports having 4 neck surgeries between 2017 and 2021, with paresthesias in both arms/hands since these surgeries.  Hx of L reverse TSA in Jan, remote hx of trigger finger release R LF PIP.  Per Rheumatology note on 09/16/24: Zvi Duplantis is a 68 y.o. male is here today for follow up of gout and osteoarthritis. The patient's allergies, current medications, past family history, past medical history, past social history, past surgical history and problem list were reviewed and updated as appropriate.    He is taking the allopurinol. He is tolerating this well. He has no gout flare since  last evaluation. He states that over all he is feeling well.  He is monitoring diet, avoid gout foods. He is followed by spine clinic regarding his degenerative disc disease. He is on naproxen for pain control.    He is getting pain of the hands.He has been to OT many years ago. He does get pain down the arms. He does get paresthesia of the hands.    He wife passed away in Apr 19, 2024. He has been under stress due to that.   Assessment and Plan    Diagnoses and all orders for this visit:   Idiopathic chronic gout of multiple sites without tophus   Primary osteoarthritis of both hands -     Ambulatory Referral to Occupational Therapy   Multilevel degenerative disc disease -     nortriptyline (PAMELOR) 10 MG capsule; Take 1 capsule (10 mg total) by mouth at bedtime  PRECAUTIONS: None  RED FLAGS: None   WEIGHT BEARING RESTRICTIONS: No  PAIN: 10/19/24: 1-2/10 pain in bilat hands Are you having pain? Yes: NPRS scale: 1-2, up to 7-8/10 at night time or with activity Pain location: R/L shoulders down to the hands Pain description: tingling/shooting Aggravating factors: prolonged positioning, night time Relieving factors: getting up and moving   FALLS: Has patient fallen in last 6 months? No  LIVING ENVIRONMENT: Lives with: lives alone with dog Lives in: 3 level home  Stairs: 5 steps from the garage, rail on the R Has following equipment at home: Single  point cane, shower chair, and Grab bars  PLOF: Independent, retired art gallery manager   PATIENT GOALS: Improve my pain.  NEXT MD VISIT: Pt will return to Dr. Tobie for rheumatology follow up in 3-4 months, and PCP in Feb   OBJECTIVE:  Note: Objective measures were completed at Evaluation unless otherwise noted.  HAND DOMINANCE: Right  ADLs: Eating: utensils occasionally slip from grip, can be challenging to cut food; pt reports stabilizing a fork can be more challenging than a knife. Grooming: indep Upper body dressing: pt reports  no difficulties with clothing fasteners Lower body dressing: indep Toileting: indep Bathing: indep IADLs: increased pain/paresthesias in BUEs with tool use/repetitive tasks/activities which require grasping/pinching/carrying/lifting  UPPER EXTREMITY ROM:  BUEs WFL for ADLs; able to make full composite fists in each hand and oppose each digit to thumb in R/L hands  UPPER EXTREMITY MMT:  Bilat shoulders 4+-5/5, 5/5 throughout bilat elbows/wrists  FUNCTIONAL OUTCOME MEASURES: 09/28/24: MAM-20 for musculoskeletal conditions: 64/80  HAND FUNCTION: Grip strength: Right: 68 lbs; Left: 79 lbs, Lateral pinch: Right: 19 lbs, Left: 24 lbs, and 3 point pinch: Right: 13 lbs, Left: 20 lbs 10/19/24: Right: 87 lbs; Left: 85 lbs, Lateral pinch: Right: 24 lbs, Left: 24 lbs, and 3 point pinch: Right: 24 lbs, Left: 31 lbs   COORDINATION: 9 Hole Peg test: Right: 34 sec; Left: 35 sec Right: 29 sec; Left: 31 sec  SENSATION: Tingling in BUEs, more severe in the RUE   EDEMA: No visible edema   COGNITION: Overall cognitive status: Within functional limits for tasks assessed  OBSERVATIONS:  Pt pleasant, cooperative, and eager to improve symptoms of pain in BUEs and weakness in the R hand.  TREATMENT DATE: 10/19/24                                                                                                                           Moist heat applied to R/L hands x5 min for pain reduction/muscle relaxation in prep for therapeutic exercises noted below.  Used intermittently throughout session during rest periods from exercises.   Self Care: -Condition management education: Review of joint protection strategies for wrists/hands, including activity modifications for ADL/IADL tasks. -Review of progress towards goals and poc   Therapeutic Exercise: -R/L grip strengthening: hand gripper set at moderate resistance with 3 red bands: 3 sets 15 reps each hand -Review of/completed nerve glides: R/L median and  ulnar nerve x3 reps each; min vc for form/technique -Tendon glides x3 reps R/L; min vc for form/technique   PATIENT EDUCATION: Education details: Condition management education Person educated: Patient Education method: Explanation and Verbal cues Education comprehension: verbalized understanding  HOME EXERCISE PROGRAM: Green theraputty, median and ulnar nerve glides, tendon glides, dowel stretches  GOALS: Goals reviewed with patient? Yes  SHORT TERM GOALS: Target date: 11/07/24  Pt will be indep to perform HEP for improving R hand strength and maintaining flexibility throughout BUEs. Baseline: Eval: Not yet initiated Goal status: INITIAL  LONG TERM GOALS:  Target date: 12/19/24  Pt will identify and implement 1-2 compensatory strategies and/or pieces of AE in order to ease performance of daily tasks.    Baseline: Eval: Education not yet initiated Goal status: INITIAL  2.  Pt will be indep to verbalize 2-3 joint protection/cumulative trauma prevention strategies to reduce pain/paresthesias in BUEs.  Baseline: Eval: Education not yet initiated Goal status: INITIAL  3.  Pt will tolerate manual therapy, therapeutic modalities, and exercises to decrease pain in BUEs to a reported 4/10 pain or less with activity.   Baseline: Eval: 7-8/10 pain in BUEs Goal status: INITIAL  4.  Pt will increase R grip strength by 10 or more lbs for improving ability to hold and operate power tools with R dominant hand. Baseline: Eval: R 68 lbs (L non-dominant 79 lbs) Goal status: INITIAL  ASSESSMENT:  CLINICAL IMPRESSION: Good tolerance to therapeutic exercises noted above.  Pt demos good understanding of HEP with use of visual handouts.  Pt has made excellent gains in strength and coordination in bilat hands.  Recommendation to continue HEP and joint protection strategies with ADL/IADL tasks, and pt will return for OT in Dec to reassess symptom management and ADL tolerance after follow up with pain  management MD.  Pt in agreement with plan.   PERFORMANCE DEFICITS: in functional skills including ADLs, IADLs, coordination, dexterity, sensation, ROM, strength, pain, flexibility, Fine motor control, body mechanics, decreased knowledge of precautions, decreased knowledge of use of DME, and UE functional use, and psychosocial skills including coping strategies, environmental adaptation, habits, and routines and behaviors.   IMPAIRMENTS: are limiting patient from ADLs, IADLs, rest and sleep, and leisure.   COMORBIDITIES: has co-morbidities such as HTN, DM, hx of cervical spinal surgeries that affects occupational performance. Patient will benefit from skilled OT to address above impairments and improve overall function.  MODIFICATION OR ASSISTANCE TO COMPLETE EVALUATION: No modification of tasks or assist necessary to complete an evaluation.  OT OCCUPATIONAL PROFILE AND HISTORY: Detailed assessment: Review of records and additional review of physical, cognitive, psychosocial history related to current functional performance.  CLINICAL DECISION MAKING: Moderate - several treatment options, min-mod task modification necessary  REHAB POTENTIAL: Good  EVALUATION COMPLEXITY: Moderate    PLAN:  OT FREQUENCY: 1-2x/week  OT DURATION: up to 12 weeks  PLANNED INTERVENTIONS: 97168 OT Re-evaluation, 97535 self care/ADL training, 02889 therapeutic exercise, 97530 therapeutic activity, 97112 neuromuscular re-education, 97140 manual therapy, 97010 moist heat, 97010 cryotherapy, 97034 contrast bath, 97750 Physical Performance Testing, 02239 Orthotic Initial, 97763 Orthotic/Prosthetic subsequent, passive range of motion, psychosocial skills training, energy conservation, coping strategies training, patient/family education, and DME and/or AE instructions  RECOMMENDED OTHER SERVICES: None at this time  CONSULTED AND AGREED WITH PLAN OF CARE: Patient  PLAN FOR NEXT SESSION: see above  Inocente Blazing,  MS, OTR/L  Inocente MARLA Blazing, OT 10/19/2024, 9:00 AM

## 2024-10-24 ENCOUNTER — Encounter
Admission: RE | Disposition: A | Discharge status: Home / Self Care | Payer: Self-pay | Source: Home / Self Care | Attending: Gastroenterology

## 2024-10-24 ENCOUNTER — Encounter: Payer: Self-pay | Admitting: Gastroenterology

## 2024-10-24 ENCOUNTER — Ambulatory Visit
Admission: RE | Admit: 2024-10-24 | Discharge: 2024-10-24 | Disposition: A | Discharge status: Home / Self Care | Attending: Gastroenterology | Admitting: Gastroenterology

## 2024-10-24 ENCOUNTER — Ambulatory Visit: Admitting: Certified Registered"

## 2024-10-24 ENCOUNTER — Other Ambulatory Visit: Payer: Self-pay | Admitting: Gastroenterology

## 2024-10-24 DIAGNOSIS — D509 Iron deficiency anemia, unspecified: Secondary | ICD-10-CM | POA: Diagnosis present

## 2024-10-24 DIAGNOSIS — E119 Type 2 diabetes mellitus without complications: Secondary | ICD-10-CM | POA: Diagnosis not present

## 2024-10-24 DIAGNOSIS — R1013 Epigastric pain: Secondary | ICD-10-CM | POA: Insufficient documentation

## 2024-10-24 DIAGNOSIS — K640 First degree hemorrhoids: Secondary | ICD-10-CM | POA: Insufficient documentation

## 2024-10-24 DIAGNOSIS — Z79899 Other long term (current) drug therapy: Secondary | ICD-10-CM | POA: Insufficient documentation

## 2024-10-24 DIAGNOSIS — R1310 Dysphagia, unspecified: Secondary | ICD-10-CM | POA: Diagnosis not present

## 2024-10-24 DIAGNOSIS — I10 Essential (primary) hypertension: Secondary | ICD-10-CM | POA: Diagnosis not present

## 2024-10-24 DIAGNOSIS — Z7984 Long term (current) use of oral hypoglycemic drugs: Secondary | ICD-10-CM | POA: Insufficient documentation

## 2024-10-24 DIAGNOSIS — K2950 Unspecified chronic gastritis without bleeding: Secondary | ICD-10-CM | POA: Insufficient documentation

## 2024-10-24 HISTORY — PX: ESOPHAGOGASTRODUODENOSCOPY: SHX5428

## 2024-10-24 HISTORY — DX: Sleep apnea, unspecified: G47.30

## 2024-10-24 HISTORY — PX: COLONOSCOPY: SHX5424

## 2024-10-24 LAB — GLUCOSE, CAPILLARY: Glucose-Capillary: 94 mg/dL (ref 70–99)

## 2024-10-24 SURGERY — COLONOSCOPY
Anesthesia: General

## 2024-10-24 MED ORDER — SODIUM CHLORIDE 0.9 % IV SOLN: INTRAVENOUS | Status: DC

## 2024-10-24 MED ORDER — LIDOCAINE HCL (CARDIAC) PF 100 MG/5ML IV SOSY
PREFILLED_SYRINGE | INTRAVENOUS | Status: DC | PRN
  Administered 2024-10-24: 100 mg via INTRAVENOUS

## 2024-10-24 MED ORDER — PROPOFOL 500 MG/50ML IV EMUL
INTRAVENOUS | Status: DC | PRN
  Administered 2024-10-24: 150 ug/kg/min via INTRAVENOUS

## 2024-10-24 MED ORDER — PROPOFOL 10 MG/ML IV BOLUS
INTRAVENOUS | Status: DC | PRN
Start: 2024-10-24 — End: 2024-10-24
  Administered 2024-10-24: 80 mg via INTRAVENOUS

## 2024-10-24 NOTE — H&P (Addendum)
 Ruel Kung , MD [address], [place], [place], [address] Phone: [phone] Fax: [phone]  Primary Care Physician:  Sherial Bail, MD   Pre-Procedure History & Physical: HPI:  Ricky Figueroa is a 68 y.o. male is here for an endoscopy and colonoscopy    Past Medical History:  Diagnosis Date   Diabetes mellitus without complication (HCC)    Hypertension     No past surgical history on file.  Prior to Admission medications   Medication Sig Start Date End Date Taking? Authorizing Provider  allopurinol (ZYLOPRIM) 300 MG tablet Take 450 mg by mouth daily. 04/27/21   [provider]  amLODipine (NORVASC) 2.5 MG tablet Take 2.5 mg by mouth in the morning and at bedtime. 11/17/23 11/16/24  [provider]  aspirin 81 MG chewable tablet Chew 81 mg by mouth daily. 05/19/18   [provider]  atorvastatin (LIPITOR) 40 MG tablet Take 40 mg by mouth daily.    [provider]  cyanocobalamin (VITAMIN B12) 1000 MCG tablet Take 1,000 mcg by mouth daily.    [provider]  cyclobenzaprine (FLEXERIL) 10 MG tablet Take 10 mg by mouth 3 (three) times daily as needed. 05/06/22   [provider]  DULoxetine (CYMBALTA) 60 MG capsule Take 60 mg by mouth 2 (two) times daily. Patient not taking: Reported on 11/20/2023 04/15/21   [provider]  Ferrous Gluconate (IRON 27 PO) Take by mouth.    [provider]  gabapentin (NEURONTIN) 600 MG tablet Take 600 mg by mouth 3 (three) times daily. 09/17/22 11/20/23  [provider]  losartan  (COZAAR ) 100 MG tablet Take 100 mg by mouth daily.    [provider]  Magnesium 250 MG TABS Take 250 mg by mouth daily.    [provider]  metFORMIN (GLUCOPHAGE) 500 MG tablet Take 1 tablet by mouth 2 (two) times daily. 04/27/21   [provider]  methocarbamol (ROBAXIN) 500 MG tablet Take 500 mg by mouth 3 (three) times daily. 01/06/22   [provider]  naratriptan (AMERGE) 2.5 MG tablet Take 2.5 mg by mouth as needed for migraine. Take one (1) tablet at onset of headache; if returns or does not resolve, may repeat after 4 hours; do not exceed five (5) mg in 24 hours.    [provider]  OZEMPIC, 0.25 OR 0.5 MG/DOSE, 2 MG/1.5ML SOPN Inject 0.5 mg into the skin once a week. 11/08/21   [provider]  RABEprazole (ACIPHEX) 20 MG tablet Take 20 mg by mouth daily. 04/19/21   [provider]  tamsulosin (FLOMAX) 0.4 MG CAPS capsule Take 0.4 mg by mouth.    [provider]  zonisamide (ZONEGRAN) 100 MG capsule Take 200 mg by mouth daily.    [provider]    Allergies as of 10/06/2024 - Review Complete 11/20/2023  Allergen Reaction Noted   Iodine Anaphylaxis 06/29/1997   Oxycodone-acetaminophen  09/29/2008    No family history on file.  Social History   Socioeconomic History   Marital status: Married    Spouse name: Not on file   Number of children: Not on file   Years of education: Not on file   Highest education level: Not on file  Occupational History   Not on file  Tobacco Use   Smoking status: Never   Smokeless tobacco: Never  Substance and Sexual Activity   Alcohol use: Yes    Comment: occasional   Drug use:  Never   Sexual activity: Not on file  Other Topics Concern   Not on file  Social History Narrative   Not on file   Social Drivers of Health   Financial Resource Strain: Low Risk  (10/05/2024)   Received from [facility]   Overall Financial Resource Strain (CARDIA)    Difficulty of Paying Living Expenses: Not hard at all  Food Insecurity: No Food Insecurity (10/05/2024)   Received from [facility]   Hunger Vital Sign    Within the past 12 months, you worried that your food would run out before you got the money to buy more.: Never true    Within the past 12 months, the food you bought just didn't last and you didn't  have money to get more.: Never true  Transportation Needs: No Transportation Needs (10/05/2024)   Received from [facility] - Transportation    In the past 12 months, has lack of transportation kept you from medical appointments or from getting medications?: No    Lack of Transportation (Non-Medical): No  Physical Activity: Inactive (10/03/2021)   Received from [facility]   Exercise Vital Sign    On average, how many days per week do you engage in moderate to strenuous exercise (like a brisk walk)?: 0 days    On average, how many minutes do you engage in exercise at this level?: 0 min  Stress: No Stress Concern Present (10/03/2021)   Received from [facility] - Occupational Stress Questionnaire    Feeling of Stress : Only a little  Social Connections: Not on file  Intimate Partner Violence: Not on file    Review of Systems: See HPI, otherwise negative ROS  Physical Exam: There were no vitals taken for this visit. General:   Alert,  pleasant and cooperative in NAD Head:  Normocephalic and atraumatic. Neck:  Supple; no masses or thyromegaly. Lungs:  Clear throughout to auscultation, normal respiratory effort.    Heart:  +S1, +S2, Regular rate and rhythm, No edema. Abdomen:  Soft, nontender and nondistended. Normal bowel sounds, without guarding, and without rebound.   Neurologic:  Alert and  oriented x4;  grossly normal neurologically.  Impression/Plan: Ricky Figueroa is here for an endoscopy and colonoscopy  to be performed for  evaluation of iron deficiency anemia, dysphagia, dyspepsia    Risks, benefits, limitations, and alternatives regarding endoscopy have been reviewed with the patient.  Questions have been answered.  All parties agreeable.   Ruel Kung, MD  10/24/2024, 8:56 AM

## 2024-10-24 NOTE — Op Note (Signed)
 [facility]astroenterology Patient Name: Ricky Figueroa Procedure Date: 10/24/2024 9:34 AM MRN: [id number] Account #: [id number] Date of Birth: 1956/02/26 Admit Type: Outpatient Age: 68 Room: [facility] ENDO ROOM 1 Gender: Male Note Status: Finalized Instrument Name: Colon Scope [id number] Procedure:             Colonoscopy Indications:           Iron deficiency anemia Providers:             Ruel Kung MD, MD Referring MD:          Rosevelt Odor Medicines:             Monitored Anesthesia Care Complications:         No immediate complications. Procedure:             Pre-Anesthesia Assessment:                        - Prior to the procedure, a History and Physical was                         performed, and patient medications, allergies and                         sensitivities were reviewed. The patient's tolerance                         of previous anesthesia was reviewed.                        - The risks and benefits of the procedure and the                         sedation options and risks were discussed with the                         patient. All questions were answered and informed                         consent was obtained.                        - ASA Grade Assessment: II - A patient with mild                         systemic disease.                        - After reviewing the risks and benefits, the patient                         was deemed in satisfactory condition to undergo the                         procedure.                        After obtaining informed consent, the colonoscope was                         passed under direct vision. Throughout the procedure,  the patient's blood pressure, pulse, and oxygen                         saturations were monitored continuously. The                         Colonoscope was introduced through the anus and                         advanced to the the cecum, identified by the                          appendiceal orifice. The colonoscopy was performed                         with ease. The patient tolerated the procedure well.                         The quality of the bowel preparation was excellent.                         The ileocecal valve, appendiceal orifice, and rectum                         were photographed. Findings:      The entire examined colon appeared normal on direct and retroflexion       views.      Non-bleeding internal hemorrhoids were found during retroflexion. The       hemorrhoids were medium-sized and Grade I (internal hemorrhoids that do       not prolapse). Impression:            - The entire examined colon is normal on direct and                         retroflexion views.                        - Non-bleeding internal hemorrhoids.                        - No specimens collected. Recommendation:        - Discharge patient to home (with escort).                        - Resume previous diet.                        - Continue present medications.                        - Repeat colonoscopy in 10 years for screening                         purposes. Procedure Code(s):     --- Professional ---                        [phone], Colonoscopy, flexible; diagnostic, including                         collection  of specimen(s) by brushing or washing, when                         performed (separate procedure) Diagnosis Code(s):     --- Professional ---                        K64.0, First degree hemorrhoids                        D50.9, Iron deficiency anemia, unspecified CPT copyright 2022 American Medical Association. All rights reserved. The codes documented in this report are preliminary and upon coder review may  be revised to meet current compliance requirements. Ruel Kung, MD Ruel Kung MD, MD 10/24/2024 10:09:57 AM This report has been signed electronically. Number of Addenda: 0 Note Initiated On: 10/24/2024 9:34 AM Scope Withdrawal Time:  0 hours 7 minutes 28 seconds  Total Procedure Duration: 0 hours 11 minutes 26 seconds  Estimated Blood Loss:  Estimated blood loss: none.      [facility]

## 2024-10-24 NOTE — Transfer of Care (Signed)
 Immediate Anesthesia Transfer of Care Note  Patient: Ricky Figueroa  Procedure(s) Performed: COLONOSCOPY EGD (ESOPHAGOGASTRODUODENOSCOPY)  Patient Location: Endoscopy Unit  Anesthesia Type:General  Level of Consciousness: drowsy  Airway & Oxygen Therapy: Patient Spontanous Breathing  Post-op Assessment: Report given to RN  Post vital signs: stable  Last Vitals:  Vitals Value Taken Time  BP 111/70 10/24/24 10:10  Temp 35.8 C 10/24/24 10:10  Pulse 72 10/24/24 10:10  Resp 14 10/24/24 10:10  SpO2 97 % 10/24/24 10:10    Last Pain:  Vitals:   10/24/24 1010  TempSrc: Temporal         Complications: No notable events documented.

## 2024-10-24 NOTE — Anesthesia Postprocedure Evaluation (Signed)
 Anesthesia Post Note  Patient: Ricky Figueroa  Procedure(s) Performed: COLONOSCOPY EGD (ESOPHAGOGASTRODUODENOSCOPY)  Patient location during evaluation: Endoscopy Anesthesia Type: General Level of consciousness: awake and alert Pain management: pain level controlled Vital Signs Assessment: post-procedure vital signs reviewed and stable Respiratory status: spontaneous breathing, nonlabored ventilation, respiratory function stable and patient connected to nasal cannula oxygen Cardiovascular status: blood pressure returned to baseline and stable Postop Assessment: no apparent nausea or vomiting Anesthetic complications: no   No notable events documented.   Last Vitals:  Vitals:   10/24/24 1010 10/24/24 1020  BP: 111/70 119/76  Pulse: 72 67  Resp: 14 12  Temp: (!) 35.8 C   SpO2: 97% 97%    Last Pain:  Vitals:   10/24/24 1020  TempSrc:   PainSc: 0-No pain                 Lendia LITTIE Mae

## 2024-10-24 NOTE — Anesthesia Preprocedure Evaluation (Signed)
 Anesthesia Evaluation  Patient identified by MRN, date of birth, ID band Patient awake    Reviewed: Allergy & Precautions, NPO status , Patient's Chart, lab work & pertinent test results  History of Anesthesia Complications Negative for: history of anesthetic complications  Airway Mallampati: III  TM Distance: >3 FB Neck ROM: full    Dental no notable dental hx.    Pulmonary neg pulmonary ROS   Pulmonary exam normal        Cardiovascular hypertension, On Medications negative cardio ROS Normal cardiovascular exam     Neuro/Psych negative neurological ROS  negative psych ROS   GI/Hepatic negative GI ROS, Neg liver ROS,,,  Endo/Other  negative endocrine ROSdiabetes, Type 2    Renal/GU negative Renal ROS  negative genitourinary   Musculoskeletal   Abdominal   Peds  Hematology negative hematology ROS (+)   Anesthesia Other Findings Past Medical History: No date: Diabetes mellitus without complication (HCC) No date: Hypertension  No past surgical history on file.     Reproductive/Obstetrics negative OB ROS                              Anesthesia Physical Anesthesia Plan  ASA: 2  Anesthesia Plan: General   Post-op Pain Management: Minimal or no pain anticipated   Induction: Intravenous  PONV Risk Score and Plan: 1 and Propofol infusion and TIVA  Airway Management Planned: Natural Airway and Nasal Cannula  Additional Equipment:   Intra-op Plan:   Post-operative Plan:   Informed Consent: I have reviewed the patients History and Physical, chart, labs and discussed the procedure including the risks, benefits and alternatives for the proposed anesthesia with the patient or authorized representative who has indicated his/her understanding and acceptance.     Dental Advisory Given  Plan Discussed with: Anesthesiologist, CRNA and Surgeon  Anesthesia Plan Comments: (Patient  consented for risks of anesthesia including but not limited to:  - adverse reactions to medications - risk of airway placement if required - damage to eyes, teeth, lips or other oral mucosa - nerve damage due to positioning  - sore throat or hoarseness - Damage to heart, brain, nerves, lungs, other parts of body or loss of life  Patient voiced understanding and assent.)        Anesthesia Quick Evaluation

## 2024-10-24 NOTE — Op Note (Signed)
 [facility] Gastroenterology Patient Name: Ricky Figueroa Procedure Date: 10/24/2024 9:35 AM MRN: [id number] Account #: [id number] Date of Birth: 1956/11/30 Admit Type: Outpatient Age: 68 Room: [facility] ENDO ROOM 1 Gender: Male Note Status: Finalized Instrument Name: Upper GI Scope [id number] Procedure:             Upper GI endoscopy Indications:           Iron deficiency anemia, Dyspepsia, Dysphagia,                         Suspected gastro-esophageal reflux disease Providers:             Ruel Kung MD, MD Referring MD:          Rosevelt Odor Medicines:             Monitored Anesthesia Care Complications:         No immediate complications. Procedure:             Pre-Anesthesia Assessment:                        - Prior to the procedure, a History and Physical was                         performed, and patient medications, allergies and                         sensitivities were reviewed. The patient's tolerance                         of previous anesthesia was reviewed.                        - The risks and benefits of the procedure and the                         sedation options and risks were discussed with the                         patient. All questions were answered and informed                         consent was obtained.                        - ASA Grade Assessment: II - A patient with mild                         systemic disease.                        After obtaining informed consent, the endoscope was                         passed under direct vision. Throughout the procedure,                         the patient's blood pressure, pulse, and oxygen                         saturations were monitored  continuously. The Endoscope                         was introduced through the mouth, and advanced to the                         third part of duodenum. The upper GI endoscopy was                         accomplished with ease. The patient tolerated the                          procedure well. Findings:      The esophagus was normal.      The entire examined stomach was normal. Biopsies were taken with a cold       forceps for histology.      The examined duodenum was normal. Biopsies were taken with a cold       forceps for histology.      The cardia and gastric fundus were normal on retroflexion. Impression:            - Normal esophagus.                        - Normal stomach. Biopsied.                        - Normal examined duodenum. Biopsied. Recommendation:        - Await pathology results.                        - Perform a colonoscopy today. Procedure Code(s):     --- Professional ---                        [id number], Esophagogastroduodenoscopy, flexible,                         transoral; with biopsy, single or multiple Diagnosis Code(s):     --- Professional ---                        D50.9, Iron deficiency anemia, unspecified                        R10.13, Epigastric pain                        R13.10, Dysphagia, unspecified CPT copyright 2022 American Medical Association. All rights reserved. The codes documented in this report are preliminary and upon coder review may  be revised to meet current compliance requirements. Ruel Kung, MD Ruel Kung MD, MD 10/24/2024 9:54:36 AM This report has been signed electronically. Number of Addenda: 0 Note Initiated On: 10/24/2024 9:35 AM Estimated Blood Loss:  Estimated blood loss: none.      [facility]

## 2024-10-25 ENCOUNTER — Ambulatory Visit

## 2024-10-26 LAB — SURGICAL PATHOLOGY

## 2024-10-27 ENCOUNTER — Ambulatory Visit

## 2024-10-31 ENCOUNTER — Ambulatory Visit

## 2024-11-08 ENCOUNTER — Ambulatory Visit

## 2024-11-11 ENCOUNTER — Ambulatory Visit

## 2024-11-15 ENCOUNTER — Ambulatory Visit

## 2024-11-18 ENCOUNTER — Ambulatory Visit

## 2024-11-22 ENCOUNTER — Ambulatory Visit: Attending: Rheumatology

## 2024-11-22 DIAGNOSIS — M19041 Primary osteoarthritis, right hand: Secondary | ICD-10-CM

## 2024-11-22 DIAGNOSIS — M6281 Muscle weakness (generalized): Secondary | ICD-10-CM | POA: Diagnosis present

## 2024-11-22 DIAGNOSIS — M19042 Primary osteoarthritis, left hand: Secondary | ICD-10-CM

## 2024-11-22 DIAGNOSIS — R278 Other lack of coordination: Secondary | ICD-10-CM | POA: Diagnosis present

## 2024-11-22 NOTE — Therapy (Unsigned)
 OUTPATIENT OCCUPATIONAL THERAPY ORTHO TREATMENT NOTE  Patient Name: Ricky Figueroa MRN: [id number] DOB:[birth year], 68 y.o., male Today's Date: 11/22/2024  PCP: [facility] REFERRING PROVIDER: Dr. Tobie, Mayur  END OF SESSION:  OT End of Session - 11/22/24 1019     Visit Number 8    Number of Visits 24    Date for Recertification  12/19/24    Progress Note Due on Visit 10    OT Start Time 1016    OT Stop Time 1100    OT Time Calculation (min) 44 min    Activity Tolerance Patient tolerated treatment well    Behavior During Therapy WFL for tasks assessed/performed         Past Medical History:  Diagnosis Date   Diabetes mellitus without complication (HCC)    Hypertension    Sleep apnea    Past Surgical History:  Procedure Laterality Date   BACK SURGERY     COLONOSCOPY N/A 10/24/2024   Procedure: COLONOSCOPY;  Surgeon: Therisa Bi, MD;  Location[facility] ENDOSCOPY;  Service: Gastroenterology;  Laterality: N/A;   ESOPHAGOGASTRODUODENOSCOPY N/A 10/24/2024   Procedure: EGD (ESOPHAGOGASTRODUODENOSCOPY);  Surgeon: Therisa Bi, MD;  Location: [facility] ENDOSCOPY;  Service: Gastroenterology;  Laterality: N/A;   JOINT REPLACEMENT     There are no active problems to display for this patient.  ONSET DATE: Worsening symptoms in the last year  REFERRING DIAG: Primary OA, right hand; Primary OA, left hand  THERAPY DIAG:  Muscle weakness (generalized)  Other lack of coordination  Primary osteoarthritis, right hand  Primary osteoarthritis, left hand  Rationale for Evaluation and Treatment: Rehabilitation  SUBJECTIVE:  SUBJECTIVE STATEMENT: Pt reports he is doing better.  And went to pain management doctor yesterday. Planning L TKA in mid Jan. Planning spinal steroid injection end of Feb.   Pt accompanied by: self  PERTINENT HISTORY:  09/26/24: Pt reports having 4 neck surgeries between 2017 and 2021, with paresthesias in both arms/hands since these surgeries.  Hx of L reverse TSA  in Jan, remote hx of trigger finger release R LF PIP.  Per Rheumatology note on 09/16/24: Deondra Wigger is a 68 y.o. male is here today for follow up of gout and osteoarthritis. The patient's allergies, current medications, past family history, past medical history, past social history, past surgical history and problem list were reviewed and updated as appropriate.    He is taking the allopurinol. He is tolerating this well. He has no gout flare since last evaluation. He states that over all he is feeling well.  He is monitoring diet, avoid gout foods. He is followed by spine clinic regarding his degenerative disc disease. He is on naproxen for pain control.    He is getting pain of the hands.He has been to OT many years ago. He does get pain down the arms. He does get paresthesia of the hands.    He wife passed away in 28-Apr-2024. He has been under stress due to that.   Assessment and Plan    Diagnoses and all orders for this visit:   Idiopathic chronic gout of multiple sites without tophus   Primary osteoarthritis of both hands -     Ambulatory Referral to Occupational Therapy   Multilevel degenerative disc disease -     nortriptyline (PAMELOR) 10 MG capsule; Take 1 capsule (10 mg total) by mouth at bedtime  PRECAUTIONS: None  RED FLAGS: None   WEIGHT BEARING RESTRICTIONS: No  PAIN: 11/22/24: 1-2/10 pain in both hands (arthritic), can  reach 7-8/10 pain ; shooting pain happens a couple times a day, sometimes it wakes pt up at night,  10/19/24: 1-2/10 pain in bilat hands Are you having pain? Yes: NPRS scale: 1-2, up to 7-8/10 at night time or with activity Pain location: R/L shoulders down to the hands Pain description: tingling/shooting Aggravating factors: prolonged positioning, night time Relieving factors: getting up and moving   FALLS: Has patient fallen in last 6 months? No  LIVING ENVIRONMENT: Lives with: lives alone with dog Lives in: 3 level home  Stairs: 5  steps from the garage, rail on the R Has following equipment at home: Single point cane, shower chair, and Grab bars  PLOF: Independent, retired art gallery manager   PATIENT GOALS: Improve my pain.  NEXT MD VISIT: Pt will return to Dr. Tobie for rheumatology follow up in 3-4 months, and PCP in Feb   OBJECTIVE:  Note: Objective measures were completed at Evaluation unless otherwise noted.  HAND DOMINANCE: Right  ADLs: Eating: utensils occasionally slip from grip, can be challenging to cut food; pt reports stabilizing a fork can be more challenging than a knife. Grooming: indep Upper body dressing: pt reports no difficulties with clothing fasteners Lower body dressing: indep Toileting: indep Bathing: indep IADLs: increased pain/paresthesias in BUEs with tool use/repetitive tasks/activities which require grasping/pinching/carrying/lifting  UPPER EXTREMITY ROM:  BUEs WFL for ADLs; able to make full composite fists in each hand and oppose each digit to thumb in R/L hands  UPPER EXTREMITY MMT:  Bilat shoulders 4+-5/5, 5/5 throughout bilat elbows/wrists  FUNCTIONAL OUTCOME MEASURES: 09/28/24: MAM-20 for musculoskeletal conditions: 64/80  HAND FUNCTION: Grip strength: Right: 68 lbs; Left: 79 lbs, Lateral pinch: Right: 19 lbs, Left: 24 lbs, and 3 point pinch: Right: 13 lbs, Left: 20 lbs 10/19/24: Right: 87 lbs; Left: 85 lbs, Lateral pinch: Right: 24 lbs, Left: 24 lbs, and 3 point pinch: Right: 24 lbs, Left: 31 lbs  11/22/24: Right: 85 lbs, Left: 82 lbs, Lateral pinch: Right: 19 , Left: 19 ; and 3 point pinch: Right: 15 , Left: 17   COORDINATION: 9 Hole Peg test: Right: 34 sec; Left: 35 sec Right: 29 sec; Left: 31 sec  SENSATION: Tingling in BUEs, more severe in the RUE   EDEMA: No visible edema   COGNITION: Overall cognitive status: Within functional limits for tasks assessed  OBSERVATIONS:  Pt pleasant, cooperative, and eager to improve symptoms of pain in BUEs and weakness in the R  hand.  TREATMENT DATE: 10/19/24                                                                                                                           Moist heat applied to R/L hands x5 min for pain reduction/muscle relaxation in prep for therapeutic exercises noted below.  Used intermittently throughout session during rest periods from exercises.   Self Care: -Condition management education: Review of joint protection strategies for wrists/hands, including activity modifications for ADL/IADL tasks. -Review of progress  towards goals and poc   Therapeutic Exercise: -R/L grip strengthening: hand gripper set at moderate resistance with 3 red bands: 3 sets 15 reps each hand -Review of/completed nerve glides: R/L median and ulnar nerve x3 reps each; min vc for form/technique -Tendon glides x3 reps R/L; min vc for form/technique   PATIENT EDUCATION: Education details: Condition management education Person educated: Patient Education method: Explanation and Verbal cues Education comprehension: verbalized understanding  HOME EXERCISE PROGRAM: Green theraputty, median and ulnar nerve glides, tendon glides, dowel stretches  GOALS: Goals reviewed with patient? Yes  SHORT TERM GOALS: Target date: 11/07/24  Pt will be indep to perform HEP for improving R hand strength and maintaining flexibility throughout BUEs. Baseline: Eval: Not yet initiated; 11/22/24: indep with visual handouts Goal status: achieved  LONG TERM GOALS: Target date: 12/19/24  Pt will identify and implement 1-2 compensatory strategies and/or pieces of AE in order to ease performance of daily tasks.    Baseline: Eval: Education not yet initiated; 11/22/24: indep with compensatory strategies  Goal status: achieved   2.  Pt will be indep to verbalize 2-3 joint protection/cumulative trauma prevention strategies to reduce pain/paresthesias in BUEs.  Baseline: Eval: Education not yet initiated; 11/22/24: indep with joint  protections strategies  Goal status: INITIAL  3.  Pt will tolerate manual therapy, therapeutic modalities, and exercises to decrease pain in BUEs to a reported 4/10 pain or less with activity.   Baseline: Eval: 7-8/10 pain in BUEs;  Goal status: INITIAL  4.  Pt will increase R grip strength by 10 or more lbs for improving ability to hold and operate power tools with R dominant hand. Baseline: Eval: R 68 lbs (L non-dominant 79 lbs) Goal status: INITIAL  ASSESSMENT:  CLINICAL IMPRESSION: Good tolerance to therapeutic exercises noted above.  Pt demos good understanding of HEP with use of visual handouts.  Pt has made excellent gains in strength and coordination in bilat hands.  Recommendation to continue HEP and joint protection strategies with ADL/IADL tasks, and pt will return for OT in Dec to reassess symptom management and ADL tolerance after follow up with pain management MD.  Pt in agreement with plan.   PERFORMANCE DEFICITS: in functional skills including ADLs, IADLs, coordination, dexterity, sensation, ROM, strength, pain, flexibility, Fine motor control, body mechanics, decreased knowledge of precautions, decreased knowledge of use of DME, and UE functional use, and psychosocial skills including coping strategies, environmental adaptation, habits, and routines and behaviors.   IMPAIRMENTS: are limiting patient from ADLs, IADLs, rest and sleep, and leisure.   COMORBIDITIES: has co-morbidities such as HTN, DM, hx of cervical spinal surgeries that affects occupational performance. Patient will benefit from skilled OT to address above impairments and improve overall function.  MODIFICATION OR ASSISTANCE TO COMPLETE EVALUATION: No modification of tasks or assist necessary to complete an evaluation.  OT OCCUPATIONAL PROFILE AND HISTORY: Detailed assessment: Review of records and additional review of physical, cognitive, psychosocial history related to current functional  performance.  CLINICAL DECISION MAKING: Moderate - several treatment options, min-mod task modification necessary  REHAB POTENTIAL: Good  EVALUATION COMPLEXITY: Moderate    PLAN:  OT FREQUENCY: 1-2x/week  OT DURATION: up to 12 weeks  PLANNED INTERVENTIONS: 97168 OT Re-evaluation, 97535 self care/ADL training, 02889 therapeutic exercise, 97530 therapeutic activity, 97112 neuromuscular re-education, 97140 manual therapy, 97010 moist heat, 97010 cryotherapy, 97034 contrast bath, 97750 Physical Performance Testing, 02239 Orthotic Initial, [id number] Orthotic/Prosthetic subsequent, passive range of motion, psychosocial skills training, energy conservation, coping strategies training, patient/family  education, and DME and/or AE instructions  RECOMMENDED OTHER SERVICES: None at this time  CONSULTED AND AGREED WITH PLAN OF CARE: Patient  PLAN FOR NEXT SESSION: see above  Inocente Blazing, MS, OTR/L  Inocente MARLA Blazing, OT 11/22/2024, 10:20 AM

## 2024-11-25 ENCOUNTER — Ambulatory Visit

## 2024-11-28 ENCOUNTER — Ambulatory Visit: Attending: Cardiology | Admitting: Cardiology

## 2024-11-28 ENCOUNTER — Encounter: Payer: Self-pay | Admitting: Cardiology

## 2024-11-28 VITALS — BP 140/62 | HR 61 | Ht 70.0 in | Wt 213.4 lb

## 2024-11-28 DIAGNOSIS — E7800 Pure hypercholesterolemia, unspecified: Secondary | ICD-10-CM | POA: Diagnosis not present

## 2024-11-28 DIAGNOSIS — Z01810 Encounter for preprocedural cardiovascular examination: Secondary | ICD-10-CM | POA: Diagnosis not present

## 2024-11-28 DIAGNOSIS — I10 Essential (primary) hypertension: Secondary | ICD-10-CM | POA: Insufficient documentation

## 2024-11-28 NOTE — Patient Instructions (Signed)

## 2024-11-28 NOTE — Progress Notes (Signed)
 " Cardiology Office Note:    Date:  11/28/2024   ID:  Ricky Figueroa, DOB [birth year], MRN [id number]  PCP:  Sherial Bail, MD   [facility] Providers Cardiologist:  Redell Cave, MD     Referring MD: Sherial Bail, MD   Chief Complaint  Patient presents with   Follow-up    12 month follow up pt has been doing well with no complaints of chest pain, chest pressure or SOB, medciation reviewed verbally with patient    History of Present Illness:    Ricky Figueroa is a 68 y.o. male with a hx of hypertension, diabetes, hyperlipidemia who presents for follow-up.    States having occasional elevated BPs, usually when stressed or unhappy.  Lost his wife 6 months ago, was feeling down but getting better now.  Planning on having the tooth extracted, also left knee surgery performed in the near future.  Has occasional dizziness usually when working outside with systolics in the 90s.  Compliant with medications as prescribed.  Endorses left knee pain, denies chest pain or breathing issues.   Prior notes Echo 05/2021 EF 60 to 65%, mild aortic regurgitation, mild aortic root dilatation Lexiscan  Myoview  05/2021 no evidence for ischemia.   Past Medical History:  Diagnosis Date   Diabetes mellitus without complication (HCC)    Hypertension    Sleep apnea     Past Surgical History:  Procedure Laterality Date   BACK SURGERY     COLONOSCOPY N/A 10/24/2024   Procedure: COLONOSCOPY;  Surgeon: Therisa Bi, MD;  Location: [facility] ENDOSCOPY;  Service: Gastroenterology;  Laterality: N/A;   ESOPHAGOGASTRODUODENOSCOPY N/A 10/24/2024   Procedure: EGD (ESOPHAGOGASTRODUODENOSCOPY);  Surgeon: Therisa Bi, MD;  Location[facility] ENDOSCOPY;  Service: Gastroenterology;  Laterality: N/A;   JOINT REPLACEMENT      Current Medications: Current Meds  Medication Sig   allopurinol (ZYLOPRIM) 300 MG tablet Take 450 mg by mouth daily.   amLODipine (NORVASC) 2.5 MG tablet Take 2.5 mg by mouth 2 (two)  times daily.   aspirin 81 MG chewable tablet Chew 81 mg by mouth daily.   atorvastatin (LIPITOR) 40 MG tablet Take 40 mg by mouth daily.   cyanocobalamin (VITAMIN B12) 1000 MCG tablet Take 1,000 mcg by mouth daily.   cyclobenzaprine (FLEXERIL) 10 MG tablet Take 10 mg by mouth 3 (three) times daily as needed.   DULoxetine (CYMBALTA) 60 MG capsule Take 60 mg by mouth 2 (two) times daily.   Ferrous Gluconate (IRON 27 PO) Take by mouth.   gabapentin (NEURONTIN) 600 MG tablet Take 600 mg by mouth 3 (three) times daily.   losartan  (COZAAR ) 100 MG tablet Take 100 mg by mouth daily.   Magnesium 250 MG TABS Take 250 mg by mouth daily.   methocarbamol (ROBAXIN) 500 MG tablet Take 500 mg by mouth 3 (three) times daily.   naratriptan (AMERGE) 2.5 MG tablet Take 2.5 mg by mouth as needed for migraine. Take one (1) tablet at onset of headache; if returns or does not resolve, may repeat after 4 hours; do not exceed five (5) mg in 24 hours.   OZEMPIC, 0.25 OR 0.5 MG/DOSE, 2 MG/1.5ML SOPN Inject 0.5 mg into the skin once a week.   RABEprazole (ACIPHEX) 20 MG tablet Take 20 mg by mouth daily.   tamsulosin (FLOMAX) 0.4 MG CAPS capsule Take 0.4 mg by mouth.   zonisamide (ZONEGRAN) 100 MG capsule Take 200 mg by mouth daily.     Allergies:   Iodine and Oxycodone-acetaminophen  Social History   Socioeconomic History   Marital status: Married    Spouse name: Not on file   Number of children: Not on file   Years of education: Not on file   Highest education level: Not on file  Occupational History   Not on file  Tobacco Use   Smoking status: Never   Smokeless tobacco: Never  Vaping Use   Vaping status: Never Used  Substance and Sexual Activity   Alcohol use: Yes    Comment: occasional   Drug use: Never   Sexual activity: Not on file  Other Topics Concern   Not on file  Social History Narrative   Not on file   Social Drivers of Health   Tobacco Use: Low Risk (11/28/2024)   Patient History     Smoking Tobacco Use: Never    Smokeless Tobacco Use: Never    Passive Exposure: Not on file  Financial Resource Strain: Low Risk  (10/05/2024)   Received from [facility]   Overall Financial Resource Strain (CARDIA)    Difficulty of Paying Living Expenses: Not hard at all  Food Insecurity: No Food Insecurity (10/05/2024)   Received from [facility]   Epic    Within the past 12 months, you worried that your food would run out before you got the money to buy more.: Never true    Within the past 12 months, the food you bought just didn't last and you didn't have money to get more.: Never true  Transportation Needs: No Transportation Needs (10/05/2024)   Received from [facility] - Transportation    In the past 12 months, has lack of transportation kept you from medical appointments or from getting medications?: No    Lack of Transportation (Non-Medical): No  Physical Activity: Not on file  Stress: Not on file  Social Connections: Not on file  Depression (EYV7-0): Not on file  Alcohol Screen: Not on file  Housing: Low Risk  (10/05/2024)   Received from [facility]   Epic    In the last 12 months, was there a time when you were not able to pay the mortgage or rent on time?: No    In the past 12 months, how many times have you moved where you were living?: 0    At any time in the past 12 months, were you homeless or living in a shelter (including now)?: No  Utilities: Not At Risk (10/05/2024)   Received from [facility]   Epic    In the past 12 months has the electric, gas, oil, or water company threatened to shut off services in your home?: No  Health Literacy: Not on file     Family History: The patient's family history is not on file.  ROS:   Please see the history of present illness.     All other systems reviewed and are negative.  EKGs/Labs/Other Studies Reviewed:    The  following studies were reviewed today:   EKG Interpretation Date/Time:  Monday November 28 2024 10:03:19 EST Ventricular Rate:  61 PR Interval:  182 QRS Duration:  100 QT Interval:  432 QTC Calculation: 434 R Axis:   -9  Text Interpretation: Normal sinus rhythm Normal ECG Confirmed by Darliss Rogue ([id number]) on 11/28/2024 10:10:37 AM    Recent Labs: No results found for requested labs within last 365 days.  Recent Lipid Panel No results found for: CHOL,  TRIG, HDL, CHOLHDL, VLDL, LDLCALC, LDLDIRECT  Outside lipid panel 12/29/2022: Total cholesterol 127, LDL 56, HDL 49, triglycerides 109.  Risk Assessment/Calculations:      Physical Exam:    VS:  BP (!) 140/62 (BP Location: Left Arm, Patient Position: Sitting, Cuff Size: Normal)   Pulse 61   Ht 5' 10 (1.778 m)   Wt 213 lb 6.4 oz (96.8 kg)   SpO2 98%   BMI 30.62 kg/m     Wt Readings from Last 3 Encounters:  11/28/24 213 lb 6.4 oz (96.8 kg)  10/24/24 203 lb (92.1 kg)  11/20/23 216 lb 6.4 oz (98.2 kg)     GEN:  Well nourished, well developed in no acute distress HEENT: Normal NECK: No JVD; No carotid bruits CARDIAC: RRR, no murmurs, rubs, gallops RESPIRATORY:  Clear to auscultation without rales, wheezing or rhonchi  ABDOMEN: Soft, non-tender, non-distended MUSCULOSKELETAL:  No edema; tenderness with movement of right arm. SKIN: Warm and dry NEUROLOGIC:  Alert and oriented x 3 PSYCHIATRIC:  Normal affect   ASSESSMENT:    1. Primary hypertension   2. Pure hypercholesterolemia   3. Pre-procedural cardiovascular examination    PLAN:    In order of problems listed above:  Hypertension, BP elevated today, usually controlled.  Left knee pain, reduced activity levels possibly contributing.  Continue losartan  100 mg daily, Norvasc 2.5 mg twice daily. Hyperlipidemia, cholesterol controlled.  Continue Lipitor 40 mg daily. Preprocedural exam, left knee surgery being planned, okay for procedure from a  cardiac perspective.  Follow-up in 6 months.   Medication Adjustments/Labs and Tests Ordered: Current medicines are reviewed at length with the patient today.  Concerns regarding medicines are outlined above.  Orders Placed This Encounter  Procedures   EKG 12-Lead    No orders of the defined types were placed in this encounter.    Patient Instructions  Medication Instructions:  Your physician recommends that you continue on your current medications as directed. Please refer to the Current Medication list given to you today.   *If you need a refill on your cardiac medications before your next appointment, please call your pharmacy*  Lab Work: No labs ordered today  If you have labs (blood work) drawn today and your tests are completely normal, you will receive your results only by: MyChart Message (if you have MyChart) OR A paper copy in the mail If you have any lab test that is abnormal or we need to change your treatment, we will call you to review the results.  Testing/Procedures: No test ordered today   Follow-Up: At [facility], you and your health needs are our priority.  As part of our continuing mission to provide you with exceptional heart care, our providers are all part of one team.  This team includes your primary Cardiologist (physician) and Advanced Practice Providers or APPs (Physician Assistants and Nurse Practitioners) who all work together to provide you with the care you need, when you need it.  Your next appointment:   6 month(s)  Provider:   You may see Redell Cave, MD or one of the following Advanced Practice Providers on your designated Care Team:   Lonni Meager, NP Lesley Maffucci, PA-C Bernardino Bring, PA-C Cadence Lafayette, PA-C Tylene Lunch, NP Barnie Hila, NP    We recommend signing up for the patient portal called MyChart.  Sign up information is provided on this After Visit Summary.  MyChart is used to connect with patients  for Virtual Visits (Telemedicine).  Patients  are able to view lab/test results, encounter notes, upcoming appointments, etc.  Non-urgent messages can be sent to your provider as well.   To learn more about what you can do with MyChart, go to [email or web address].            Signed, Redell Cave, MD  11/28/2024 10:36 AM    [facility] "

## 2024-11-29 ENCOUNTER — Ambulatory Visit

## 2024-12-06 ENCOUNTER — Ambulatory Visit

## 2024-12-12 ENCOUNTER — Ambulatory Visit

## 2024-12-14 ENCOUNTER — Ambulatory Visit

## 2024-12-19 ENCOUNTER — Ambulatory Visit

## 2024-12-21 ENCOUNTER — Ambulatory Visit

## 2025-05-30 ENCOUNTER — Ambulatory Visit: Admitting: Cardiology
